# Patient Record
Sex: Female | Born: 1995 | Hispanic: Yes | Marital: Single | Smoking: Never smoker
Health system: Southern US, Community
[De-identification: ages and names within clinical notes are randomized; demographics above are authoritative.]

## PROBLEM LIST (undated history)

## (undated) DIAGNOSIS — E282 Polycystic ovarian syndrome: Secondary | ICD-10-CM

## (undated) DIAGNOSIS — Z9889 Other specified postprocedural states: Secondary | ICD-10-CM

## (undated) HISTORY — DX: Polycystic ovarian syndrome: E28.2

---
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
## 2018-11-30 ENCOUNTER — Emergency Department: Payer: 59

## 2018-11-30 ENCOUNTER — Emergency Department
Admission: EM | Admit: 2018-11-30 | Discharge: 2018-12-01 | Disposition: A | Discharge status: Home / Self Care | Payer: 59 | Attending: Emergency Medicine | Admitting: Emergency Medicine

## 2018-11-30 ENCOUNTER — Encounter: Payer: Self-pay | Admitting: Radiology

## 2018-11-30 DIAGNOSIS — R1031 Right lower quadrant pain: Secondary | ICD-10-CM | POA: Diagnosis not present

## 2018-11-30 LAB — POCT PREGNANCY, URINE: Preg Test, Ur: NEGATIVE

## 2018-11-30 LAB — URINALYSIS, COMPLETE (UACMP) WITH MICROSCOPIC
Bacteria, UA: NONE SEEN
Bilirubin Urine: NEGATIVE
Glucose, UA: NEGATIVE mg/dL
Hgb urine dipstick: NEGATIVE
Ketones, ur: NEGATIVE mg/dL
Leukocytes,Ua: NEGATIVE
Nitrite: NEGATIVE
Protein, ur: NEGATIVE mg/dL
Specific Gravity, Urine: 1.026 (ref 1.005–1.030)
pH: 7 (ref 5.0–8.0)

## 2018-11-30 LAB — COMPREHENSIVE METABOLIC PANEL
ALT: 17 U/L (ref 0–44)
AST: 19 U/L (ref 15–41)
Albumin: 4.4 g/dL (ref 3.5–5.0)
Alkaline Phosphatase: 103 U/L (ref 38–126)
Anion gap: 9 (ref 5–15)
BUN: 11 mg/dL (ref 6–20)
CO2: 26 mmol/L (ref 22–32)
Calcium: 9.3 mg/dL (ref 8.9–10.3)
Chloride: 103 mmol/L (ref 98–111)
Creatinine, Ser: 0.72 mg/dL (ref 0.44–1.00)
GFR calc Af Amer: >60 mL/min (ref >60)
GFR calc non Af Amer: >60 mL/min (ref >60)
Glucose, Bld: 121 mg/dL — ABNORMAL HIGH (ref 70–99)
Potassium: 3.2 mmol/L — ABNORMAL LOW (ref 3.5–5.1)
Sodium: 138 mmol/L (ref 135–145)
Total Bilirubin: 0.7 mg/dL (ref 0.3–1.2)
Total Protein: 7.6 g/dL (ref 6.5–8.1)

## 2018-11-30 LAB — CBC
HCT: 40.8 % (ref 36.0–46.0)
Hemoglobin: 13.9 g/dL (ref 12.0–15.0)
MCH: 29.1 pg (ref 26.0–34.0)
MCHC: 34.1 g/dL (ref 30.0–36.0)
MCV: 85.4 fL (ref 80.0–100.0)
Platelets: 269 10*3/uL (ref 150–400)
RBC: 4.78 MIL/uL (ref 3.87–5.11)
RDW: 11.7 % (ref 11.5–15.5)
WBC: 9.2 10*3/uL (ref 4.0–10.5)
nRBC: 0 % (ref 0.0–0.2)

## 2018-11-30 LAB — LIPASE, BLOOD: Lipase: 32 U/L (ref 11–51)

## 2018-11-30 MED ORDER — ONDANSETRON HCL 4 MG/2ML IJ SOLN
4.0000 mg | Freq: Once | INTRAMUSCULAR | Status: AC
  Administered 2018-11-30: 4 mg via INTRAVENOUS
  Filled 2018-11-30: qty 2

## 2018-11-30 MED ORDER — IOHEXOL 300 MG/ML  SOLN
100.0000 mL | Freq: Once | INTRAMUSCULAR | Status: AC | PRN
  Administered 2018-11-30: 100 mL via INTRAVENOUS
  Filled 2018-11-30: qty 100

## 2018-11-30 MED ORDER — IOHEXOL 9 MG/ML PO SOLN
1000.0000 mL | Freq: Once | ORAL | Status: AC
  Administered 2018-11-30: 1000 mL via ORAL
  Filled 2018-11-30: qty 1000

## 2018-11-30 MED ORDER — SODIUM CHLORIDE 0.9 % IV BOLUS
1000.0000 mL | Freq: Once | INTRAVENOUS | Status: AC
  Administered 2018-11-30: 22:00:00 1000 mL via INTRAVENOUS

## 2018-11-30 NOTE — ED Provider Notes (Signed)
[facility] Emergency Department Provider Note ____________________________________________   First MD Initiated Contact with Patient 11/30/18 2034     (approximate)  I have reviewed the triage vital signs and the nursing notes.   HISTORY  Chief Complaint Abdominal Pain    HPI Kim Maxwell is a 23 y.o. female with no significant PMH who presents with right lower quadrant abdominal pain, acute onset over the last several hours, persistent course, and associated with nausea but no vomiting.  The patient states that the pain started vaguely in the periumbilical area, and then moved to the right lower quadrant.  She denies any prior history of this pain.  She has no urinary symptoms, vaginal bleeding or discharge, or diarrhea.  No past medical history on file.  There are no active problems to display for this patient.   No past surgical history on file.  Prior to Admission medications   Medication Sig Start Date End Date Taking? Authorizing Provider  predniSONE (DELTASONE) 20 MG tablet Take 2 tablets (40 mg total) by mouth daily. 06/25/15   Eula Listen, MD    Allergies Patient has no known allergies.  No family history on file.  Social History Social History   Tobacco Use  . Smoking status: Never Smoker  Substance Use Topics  . Alcohol use: No  . Drug use: Not on file    Review of Systems  Constitutional: Positive for subjective fever. Eyes: No visual changes. ENT: No sore throat. Cardiovascular: Denies chest pain. Respiratory: Denies shortness of breath. Gastrointestinal: Positive for nausea.  No vomiting or diarrhea.  Genitourinary: Negative for dysuria.  No vaginal bleeding or discharge. Musculoskeletal: Negative for back pain. Skin: Negative for rash. Neurological: Negative for headache.   ____________________________________________   PHYSICAL EXAM:  VITAL SIGNS: ED Triage Vitals  Enc Vitals Group     BP  11/30/18 1933 (!) 149/86     Pulse Rate 11/30/18 1933 70     Resp 11/30/18 1933 18     Temp 11/30/18 1933 99.1 F (37.3 C)     Temp Source 11/30/18 1938 Oral     SpO2 11/30/18 1933 100 %     Weight --      Height --      Head Circumference --      Peak Flow --      Pain Score --      Pain Loc --      Pain Edu? --      Excl. in [place]? --     Constitutional: Alert and oriented. Well appearing and in no acute distress. Eyes: Conjunctivae are normal.  No scleral icterus. Head: Atraumatic. Nose: No congestion/rhinnorhea. Mouth/Throat: Mucous membranes are moist.   Neck: Normal range of motion.  Cardiovascular: Good peripheral circulation. Respiratory: Normal respiratory effort.  No retractions. Gastrointestinal: Soft with mild right lower quadrant tenderness.  No distention.  Genitourinary: No flank tenderness. Musculoskeletal: Extremities warm and well perfused.  Neurologic:  Normal speech and language. No gross focal neurologic deficits are appreciated.  Skin:  Skin is warm and dry. No rash noted. Psychiatric: Mood and affect are normal. Speech and behavior are normal.  ____________________________________________   LABS (all labs ordered are listed, but only abnormal results are displayed)  Labs Reviewed  COMPREHENSIVE METABOLIC PANEL - Abnormal; Notable for the following components:      Result Value   Potassium 3.2 (*)    Glucose, Bld 121 (*)    All other components within normal limits  URINALYSIS, COMPLETE (UACMP) WITH MICROSCOPIC - Abnormal; Notable for the following components:   Color, Urine YELLOW (*)    APPearance CLEAR (*)    All other components within normal limits  LIPASE, BLOOD  CBC  POCT PREGNANCY, URINE  POC URINE PREG, ED   ____________________________________________  EKG  ____________________________________________  RADIOLOGY  CT abdomen: Pending  ____________________________________________   PROCEDURES  Procedure(s) performed: No   Procedures  Critical Care performed: No ____________________________________________   INITIAL IMPRESSION / ASSESSMENT AND PLAN / ED COURSE  Pertinent labs & imaging results that were available during my care of the patient were reviewed by me and considered in my medical decision making (see chart for details).  23 year old female with no significant PMH presents with abdominal pain, initially more periumbilical but now localized to the right lower quadrant.  It is associated with nausea but no vomiting.  She denies prior history of this pain.  On exam, the patient is well-appearing.  Her vital signs are normal except for a low-grade temperature.  She does have mild focal right lower quadrant tenderness but no peritoneal signs.  Overall I am concerned for appendicitis.  Differential also includes terminal ileitis, mesenteric adenitis or other benign etiology.  Initial lab work-up is unremarkable.  The urinalysis is negative.  We will obtain a CT to evaluate further.  ----------------------------------------- 9:48 PM on 11/30/2018 -----------------------------------------  CT abdomen is pending.  I have signed the patient out to the oncoming physician Dr. Lenard Lance.  ____________________________________________   FINAL CLINICAL IMPRESSION(S) / ED DIAGNOSES  Final diagnoses:  Right lower quadrant abdominal pain      NEW MEDICATIONS STARTED DURING THIS VISIT:  New Prescriptions   No medications on file     Note:  This document was prepared using Dragon voice recognition software and may include unintentional dictation errors.    Dionne Bucy, MD 11/30/18 2149

## 2018-11-30 NOTE — ED Notes (Signed)
PT to CT scan

## 2018-11-30 NOTE — ED Triage Notes (Signed)
Pt c/o LRQ abdominal pain since 1730 today. Pt denies V/D but has nausea. Pt denies urinary symptoms.

## 2018-12-01 NOTE — Discharge Instructions (Signed)
Your lab tests and CT scan of the abdomen and pelvis were all okay today.  Take anti-inflammatory medicine such as ibuprofen and/or Tylenol for pain control and monitor your symptoms.  If symptoms are worsening or you develop fever, vomiting, or severe pain please return to the emergency room.  If symptoms have not resolved within 24 hours, please go to primary care for reassessment.

## 2018-12-01 NOTE — ED Provider Notes (Signed)
Procedures     ----------------------------------------- 12:10 AM on 12/01/2018 -----------------------------------------  Assumed care pending CT scan.  Vital signs remain normal.  CT scan is unremarkable with a normal-appearing appendix.  Lab work-up is reassuring.  No clear source of the pain, but at this point I doubt appendicitis, torsion TOA PID STI cystitis pyelonephritis ureterolithiasis or other acute intra-abdominal process.  Stable for outpatient follow-up, NSAIDs for pain control.   Carrie Mew, MD 12/01/18 [phone]

## 2019-10-04 ENCOUNTER — Other Ambulatory Visit: Payer: Self-pay

## 2019-10-04 ENCOUNTER — Ambulatory Visit (INDEPENDENT_AMBULATORY_CARE_PROVIDER_SITE_OTHER): Payer: Self-pay | Admitting: Gastroenterology

## 2019-10-04 VITALS — BP 142/98 | HR 103 | Temp 98.7°F | Ht <= 58 in | Wt 161.0 lb

## 2019-10-04 DIAGNOSIS — K581 Irritable bowel syndrome with constipation: Secondary | ICD-10-CM

## 2019-10-04 NOTE — Patient Instructions (Signed)

## 2019-10-04 NOTE — Progress Notes (Signed)
[name] MD, MRCP(U.K) [address]  [address]  [place], [place] [address]  Main: [phone]  Fax: [phone]   Gastroenterology Consultation  Referring Provider:     Toy Cookey, FNP Primary Care Physician:  Patient, No Pcp Per Primary Gastroenterologist:  Dr. [name]  Reason for Consultation:     Abdominal pain        HPI:   Kim Maxwell is a 24 y.o. y/o female has been referred for abdominal pain on 09/27/2019.  Review of epic shows no recent labs last hemoglobin in November 2020 was 13.9 g she underwent a CT scan of the abdomen and pelvis in November 2020 for abdominal pain with concern for acute appendicitis which showed no acute abnormality but some mild bladder wall thickening which could possibly indicate cystitis  She presented to the emergency room in November 2020 with right lower quadrant pain and after the CT scan was sent home.  She says that she has had abdominal pain her entire life.  Points to her epigastrium.  On and off.  Usually right after she eats.  Relieved after good bowel movement.  She does also suffer from constipation her whole life.  Does not have a bowel movement daily.  At times it is very hard.  When it is very hard her abdominal symptoms are worse.  Has not tried any laxatives.  Does have a diet rich in fruit and vegetable.  No family history of colon cancer or polyps.  No weight loss or other weight gain.  Denies any NSAID use.  The pain is nonradiating.  Over the last few months it is better but still occurs on a regular basis.  No family history of gallbladder problems.   No past medical history on file.  No past surgical history on file.  Prior to Admission medications   Medication Sig Start Date End Date Taking? Authorizing Provider  predniSONE (DELTASONE) 20 MG tablet Take 2 tablets (40 mg total) by mouth daily. 06/25/15   Rockne Menghini, MD    No family history on file.   Social History   Tobacco Use  . Smoking  status: Never Smoker  Substance Use Topics  . Alcohol use: No  . Drug use: Not on file    Allergies as of 10/04/2019  . (No Known Allergies)    Review of Systems:    All systems reviewed and negative except where noted in HPI.   Physical Exam:  BP (!) 142/98   Pulse (!) 103   Temp 98.7 F (37.1 C)   Ht 4\' 10"  (1.473 m)   Wt 161 lb (73 kg)   BMI 33.65 kg/m  No LMP recorded. Psych:  Alert and cooperative. Normal mood and affect. General:   Alert,  Well-developed, well-nourished, pleasant and cooperative in NAD Head:  Normocephalic and atraumatic. Eyes:  Sclera clear, no icterus.   Conjunctiva pink. Ears:  Normal auditory acuity. Lungs:  Respirations even and unlabored.  Clear throughout to auscultation.   No wheezes, crackles, or rhonchi. No acute distress. Heart:  Regular rate and rhythm; no murmurs, clicks, rubs, or gallops. Abdomen:  Normal bowel sounds.  No bruits.  Soft, non-tender and non-distended without masses, hepatosplenomegaly or hernias noted.  No guarding or rebound tenderness.    Neurologic:  Alert and oriented x3;  grossly normal neurologically. Psych:  Alert and cooperative. Normal mood and affect.  Imaging Studies: No results found.  Assessment and Plan:   [name] is a 24  y.o. 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  No red flag signs.  Plan 1.  High-fiber diet: Counseled on obtaining a target of 25 to 30 g/day.  Patient information provided. 2.  Commence on Linzess 72 mcg: Samples provided 3.  Check H. pylori breath test, TSH 4.  If pain is not relieved with effective treatment of her constipation then may consider gallbladder evaluation  Follow up in 4 weeks telephone visit  Dr [name] MD,MRCP(U.K)

## 2019-10-05 LAB — T4, FREE: Free T4: 1.27 ng/dL (ref 0.82–1.77)

## 2019-10-05 LAB — SPECIMEN STATUS REPORT

## 2019-10-05 NOTE — Progress Notes (Signed)
Inform TSH is abnormal - check free t 4

## 2019-10-06 LAB — H. PYLORI BREATH TEST: H pylori Breath Test: NEGATIVE

## 2019-10-06 LAB — TSH: TSH: 5.59 u[IU]/mL — ABNORMAL HIGH (ref 0.450–4.500)

## 2019-10-20 ENCOUNTER — Telehealth: Payer: Self-pay

## 2019-10-20 NOTE — Telephone Encounter (Signed)
-----   Message from [name], MD sent at 10/19/2019  9:15 AM EDT ----- Free t4 normal- repeat TSH, Free t4 in 3-4 months and follow up with PCP

## 2019-10-20 NOTE — Telephone Encounter (Signed)
Called pt to inform of results and Dr. Anna's recommendations.  Unable to contact, LVM to return call 

## 2019-10-20 NOTE — Telephone Encounter (Signed)
Patient returning call. Call forwarded to [facility].

## 2019-10-20 NOTE — Telephone Encounter (Signed)
Pt has been notified of results and Dr. Anna's recommendations. 

## 2019-12-19 ENCOUNTER — Ambulatory Visit: Payer: Self-pay | Admitting: Gastroenterology

## 2020-02-08 ENCOUNTER — Other Ambulatory Visit: Payer: Self-pay

## 2020-02-08 ENCOUNTER — Encounter: Payer: Self-pay | Admitting: Gastroenterology

## 2020-02-08 ENCOUNTER — Ambulatory Visit (INDEPENDENT_AMBULATORY_CARE_PROVIDER_SITE_OTHER): Payer: Self-pay | Admitting: Gastroenterology

## 2020-02-08 VITALS — BP 136/90 | HR 86 | Temp 98.4°F | Ht <= 58 in | Wt 163.6 lb

## 2020-02-08 DIAGNOSIS — R110 Nausea: Secondary | ICD-10-CM

## 2020-02-08 DIAGNOSIS — K219 Gastro-esophageal reflux disease without esophagitis: Secondary | ICD-10-CM

## 2020-02-08 MED ORDER — OMEPRAZOLE 40 MG PO CPDR: 40.0000 mg | DELAYED_RELEASE_CAPSULE | Freq: Every day | ORAL | 0 refills | Status: DC

## 2020-02-08 NOTE — Patient Instructions (Signed)
Low-FODMAP Eating Plan  FODMAP stands for fermentable oligosaccharides, disaccharides, monosaccharides, and polyols. These are sugars that are hard for some people to digest. A low-FODMAP eating plan may help some people who have irritable bowel syndrome (IBS) and certain other bowel (intestinal) diseases to manage their symptoms. This meal plan can be complicated to follow. Work with a diet and nutrition specialist (dietitian) to make a low-FODMAP eating plan that is right for you. A dietitian can help make sure that you get enough nutrition from this diet. What are tips for following this plan? Reading food labels  Check labels for hidden FODMAPs such as: ? High-fructose syrup. ? Honey. ? Agave. ? Natural fruit flavors. ? Onion or garlic powder.  Choose low-FODMAP foods that contain 3-4 grams of fiber per serving.  Check food labels for serving sizes. Eat only one serving at a time to make sure FODMAP levels stay low. Shopping  Shop with a list of foods that are recommended on this diet and make a meal plan. Meal planning  Follow a low-FODMAP eating plan for up to 6 weeks, or as told by your health care provider or dietitian.  To follow the eating plan: 1. Eliminate high-FODMAP foods from your diet completely. Choose only low-FODMAP foods to eat. You will do this for 2-6 weeks. 2. Gradually reintroduce high-FODMAP foods into your diet one at a time. Most people should wait a few days before introducing the next new high-FODMAP food into their meal plan. Your dietitian can recommend how quickly you may reintroduce foods. 3. Keep a daily record of what and how much you eat and drink. Make note of any symptoms that you have after eating. 4. Review your daily record with a dietitian regularly to identify which foods you can eat and which foods you should avoid. General tips  Drink enough fluid each day to keep your urine pale yellow.  Avoid processed foods. These often have added sugar  and may be high in FODMAPs.  Avoid most dairy products, whole grains, and sweeteners.  Work with a dietitian to make sure you get enough fiber in your diet.  Avoid high FODMAP foods at meals to manage symptoms. Recommended foods Fruits Bananas, oranges, tangerines, lemons, limes, blueberries, raspberries, strawberries, grapes, cantaloupe, honeydew melon, kiwi, papaya, passion fruit, and pineapple. Limited amounts of dried cranberries, banana chips, and shredded coconut. Vegetables Eggplant, zucchini, cucumber, peppers, green beans, bean sprouts, lettuce, arugula, kale, Swiss chard, spinach, collard greens, bok choy, summer squash, potato, and tomato. Limited amounts of corn, carrot, and sweet potato. Green parts of scallions. Grains Gluten-free grains, such as rice, oats, buckwheat, quinoa, corn, polenta, and millet. Gluten-free pasta, bread, or cereal. Rice noodles. Corn tortillas. Meats and other proteins Unseasoned beef, pork, poultry, or fish. Eggs. Berniece Salines. Tofu (firm) and tempeh. Limited amounts of nuts and seeds, such as almonds, walnuts, [place] nuts, pecans, peanuts, nut butters, pumpkin seeds, chia seeds, and sunflower seeds. Dairy Lactose-free milk, yogurt, and kefir. Lactose-free cottage cheese and ice cream. Non-dairy milks, such as almond, coconut, hemp, and rice milk. Non-dairy yogurt. Limited amounts of goat cheese, brie, mozzarella, parmesan, swiss, and other hard cheeses. Fats and oils Butter-free spreads. Vegetable oils, such as olive, canola, and sunflower oil. Seasoning and other foods Artificial sweeteners with names that do not end in "ol," such as aspartame, saccharine, and stevia. Maple syrup, white table sugar, raw sugar, brown sugar, and molasses. Mayonnaise, soy sauce, and tamari. Fresh basil, coriander, parsley, rosemary, and thyme. Beverages Water and  mineral water. Sugar-sweetened soft drinks. Small amounts of orange juice or cranberry juice. Black and green tea.  Most dry wines. Coffee. The items listed above may not be a complete list of foods and beverages you can eat. Contact a dietitian for more information. Foods to avoid Fruits Fresh, dried, and juiced forms of apple, pear, watermelon, peach, plum, cherries, apricots, blackberries, boysenberries, figs, nectarines, and mango. Avocado. Vegetables Chicory root, artichoke, asparagus, cabbage, snow peas, Brussels sprouts, broccoli, sugar snap peas, mushrooms, celery, and cauliflower. Onions, garlic, leeks, and the white part of scallions. Grains Wheat, including kamut, durum, and semolina. Barley and bulgur. Couscous. Wheat-based cereals. Wheat noodles, bread, crackers, and pastries. Meats and other proteins Fried or fatty meat. Sausage. Cashews and pistachios. Soybeans, baked beans, black beans, chickpeas, kidney beans, fava beans, navy beans, lentils, black-eyed peas, and split peas. Dairy Milk, yogurt, ice cream, and soft cheese. Cream and sour cream. Milk-based sauces. Custard. Buttermilk. Soy milk. Seasoning and other foods Any sugar-free gum or candy. Foods that contain artificial sweeteners such as sorbitol, mannitol, isomalt, or xylitol. Foods that contain honey, high-fructose corn syrup, or agave. Bouillon, vegetable stock, beef stock, and chicken stock. Garlic and onion powder. Condiments made with onion, such as hummus, chutney, pickles, relish, salad dressing, and salsa. Tomato paste. Beverages Chicory-based drinks. Coffee substitutes. Chamomile tea. Fennel tea. Sweet or fortified wines such as port or sherry. Diet soft drinks made with isomalt, mannitol, maltitol, sorbitol, or xylitol. Apple, pear, and mango juice. Juices with high-fructose corn syrup. The items listed above may not be a complete list of foods and beverages you should avoid. Contact a dietitian for more information. Summary  FODMAP stands for fermentable oligosaccharides, disaccharides, monosaccharides, and polyols. These  are sugars that are hard for some people to digest.  A low-FODMAP eating plan is a short-term diet that helps to ease symptoms of certain bowel diseases.  The eating plan usually lasts up to 6 weeks. After that, high-FODMAP foods are reintroduced gradually and one at a time. This can help you find out which foods may be causing symptoms.  A low-FODMAP eating plan can be complicated. It is best to work with a dietitian who has experience with this type of plan. This information is not intended to replace advice given to you by your health care provider. Make sure you discuss any questions you have with your health care provider. Document Revised: 05/25/2019 Document Reviewed: 05/25/2019 Elsevier Patient Education  2021 Elsevier Inc. Gastroesophageal Reflux Disease, Adult  Gastroesophageal reflux (GER) happens when acid from the stomach flows up into the tube that connects the mouth and the stomach (esophagus). Normally, food travels down the esophagus and stays in the stomach to be digested. With GER, food and stomach acid sometimes move back up into the esophagus. You may have a disease called gastroesophageal reflux disease (GERD) if the reflux:  Happens often.  Causes frequent or very bad symptoms.  Causes problems such as damage to the esophagus. When this happens, the esophagus becomes sore and swollen. Over time, GERD can make small holes (ulcers) in the lining of the esophagus. What are the causes? This condition is caused by a problem with the muscle between the esophagus and the stomach. When this muscle is weak or not normal, it does not close properly to keep food and acid from coming back up from the stomach. The muscle can be weak because of:  Tobacco use.  Pregnancy.  Having a certain type of hernia (hiatal hernia).  Alcohol use.  Certain foods and drinks, such as coffee, chocolate, onions, and peppermint. What increases the risk?  Being overweight.  Having a  disease that affects your connective tissue.  Taking NSAIDs, such a ibuprofen. What are the signs or symptoms?  Heartburn.  Difficult or painful swallowing.  The feeling of having a lump in the throat.  A bitter taste in the mouth.  Bad breath.  Having a lot of saliva.  Having an upset or bloated stomach.  Burping.  Chest pain. Different conditions can cause chest pain. Make sure you see your doctor if you have chest pain.  Shortness of breath or wheezing.  A long-term cough or a cough at night.  Wearing away of the surface of teeth (tooth enamel).  Weight loss. How is this treated?  Making changes to your diet.  Taking medicine.  Having surgery. Treatment will depend on how bad your symptoms are. Follow these instructions at home: Eating and drinking  Follow a diet as told by your doctor. You may need to avoid foods and drinks such as: ? Coffee and tea, with or without caffeine. ? Drinks that contain alcohol. ? Energy drinks and sports drinks. ? Bubbly (carbonated) drinks or sodas. ? Chocolate and cocoa. ? Peppermint and mint flavorings. ? Garlic and onions. ? Horseradish. ? Spicy and acidic foods. These include peppers, chili powder, curry powder, vinegar, hot sauces, and BBQ sauce. ? Citrus fruit juices and citrus fruits, such as oranges, lemons, and limes. ? Tomato-based foods. These include red sauce, chili, salsa, and pizza with red sauce. ? Fried and fatty foods. These include donuts, french fries, potato chips, and high-fat dressings. ? High-fat meats. These include hot dogs, rib eye steak, sausage, ham, and bacon. ? High-fat dairy items, such as whole milk, butter, and cream cheese.  Eat small meals often. Avoid eating large meals.  Avoid drinking large amounts of liquid with your meals.  Avoid eating meals during the 2-3 hours before bedtime.  Avoid lying down right after you eat.  Do not exercise right after you eat.   Lifestyle  Do not  smoke or use any products that contain nicotine or tobacco. If you need help quitting, ask your doctor.  Try to lower your stress. If you need help doing this, ask your doctor.  If you are overweight, lose an amount of weight that is healthy for you. Ask your doctor about a safe weight loss goal.   General instructions  Pay attention to any changes in your symptoms.  Take over-the-counter and prescription medicines only as told by your doctor.  Do not take aspirin, ibuprofen, or other NSAIDs unless your doctor says it is okay.  Wear loose clothes. Do not wear anything tight around your waist.  Raise (elevate) the head of your bed about 6 inches (15 cm). You may need to use a wedge to do this.  Avoid bending over if this makes your symptoms worse.  Keep all follow-up visits. Contact a doctor if:  You have new symptoms.  You lose weight and you do not know why.  You have trouble swallowing or it hurts to swallow.  You have wheezing or a cough that keeps happening.  You have a hoarse voice.  Your symptoms do not get better with treatment. Get help right away if:  You have sudden pain in your arms, neck, jaw, teeth, or back.  You suddenly feel sweaty, dizzy, or light-headed.  You have chest pain or shortness of breath.  You vomit and the vomit is green, yellow, or black, or it looks like blood or coffee grounds.  You faint.  Your poop (stool) is red, bloody, or black.  You cannot swallow, drink, or eat. These symptoms may represent a serious problem that is an emergency. Do not wait to see if the symptoms will go away. Get medical help right away. Call your local emergency services (911 in the [place] Do not drive yourself to the hospital. Summary  If a person has gastroesophageal reflux disease (GERD), food and stomach acid move back up into the esophagus and cause symptoms or problems such as damage to the esophagus.  Treatment will depend on how bad your symptoms  are.  Follow a diet as told by your doctor.  Take all medicines only as told by your doctor. This information is not intended to replace advice given to you by your health care provider. Make sure you discuss any questions you have with your health care provider. Document Revised: 07/17/2019 Document Reviewed: 07/17/2019 Elsevier Patient Education  2021 [employer].

## 2020-02-08 NOTE — Progress Notes (Signed)
[name] MD, MRCP(U.K) [address]  [address]  [place], [place] [address]  Main: [phone]  Fax: [phone]   Primary Care Physician: Santiago Bur, [place]  Primary Gastroenterologist:  Dr. [name]   IBS follow-up  HPI: Kim Maxwell is a 25 y.o. female    Summary of history :  Initially referred and seen in 10/09/2019 for abdominal pain.  CT scan of the abdomen pelvis in November 2020 showed no acute abnormalities but blood thickening which could be cystitis.  At that time she had presented the emergency room with right lower quadrant pain and was discharged home.She says that she has had abdominal pain her entire life.  Points to her epigastrium.  On and off.  Usually right after she eats.  Relieved after good bowel movement.  She does also suffer from constipation her whole life.  Does not have a bowel movement daily.  At times it is very hard.  When it is very hard her abdominal symptoms are worse.  Has not tried any laxatives.  Does have a diet rich in fruit and vegetable.  No family history of colon cancer or polyps.  No weight loss or other weight gain.  Denies any NSAID use.  The pain is nonradiating.  Over the last few months it is better but still occurs on a regular basis.  No family history of gallbladder problems.  Interval history 10/04/2019-02/08/2020  At her last visit commenced on high-fiber diet and commenced on Linzess 72 mcg/day.  H. pylori breath test was negative and had a TSH that was elevated but normal free T4.  Seen at the emergency room today at [facility] for acute cystitis.  Urine analysis showed large quantity of blood WBC mucus and leukocyte esterase.  Given a course of nitrofurantoin.  She states that after her last visit a few months back she felt much better after taking the Linzess most of her symptoms disappeared and she gained about 15 pounds of weight and around Christmas time she started developing nausea to the later part of her day.  Was  worse when she lays flat.  Denies any heartburn.  Denies any NSAID use.  The nausea began a few hours after eating associated with some gas and bloating.  Presently denies any issues with constipation.  Current Outpatient Medications  Medication Sig Dispense Refill  . nitrofurantoin, macrocrystal-monohydrate, (MACROBID) 100 MG capsule Take 100 mg by mouth in the morning and at bedtime.    . ondansetron (ZOFRAN) 4 MG tablet Take 4 mg by mouth 3 (three) times daily as needed.    . predniSONE (DELTASONE) 20 MG tablet Take 2 tablets (40 mg total) by mouth daily. 8 tablet 0   No current facility-administered medications for this visit.    Allergies as of 02/08/2020  . (No Known Allergies)    ROS:  General: Negative for anorexia, weight loss, fever, chills, fatigue, weakness. ENT: Negative for hoarseness, difficulty swallowing , nasal congestion. CV: Negative for chest pain, angina, palpitations, dyspnea on exertion, peripheral edema.  Respiratory: Negative for dyspnea at rest, dyspnea on exertion, cough, sputum, wheezing.  GI: See history of present illness. GU:  Negative for dysuria, hematuria, urinary incontinence, urinary frequency, nocturnal urination.  Endo: Negative for unusual weight change.    Physical Examination:   BP 136/90 (BP Location: Left Arm, Patient Position: Sitting, Cuff Size: Normal)   Pulse 86   Temp 98.4 F (36.9 C) (Oral)   Ht 4\' 10"  (1.473 m)  Wt 163 lb 9.6 oz (74.2 kg)   BMI 34.19 kg/m   General: Well-nourished, well-developed in no acute distress.  Eyes: No icterus. Conjunctivae pink. Neuro: Alert and oriented x 3.  Grossly intact. Skin: Warm and dry, no jaundice.   Psych: Alert and cooperative, normal mood and affect.   Imaging Studies: No results found.  Assessment and Plan:   Kim Maxwell is a 25 y.o. y/o female here to follow-up for chronic abdominal pain suggestive of IBS constipation which has since resolved.  Present issues are nausea  which is likely due to GERD which has worsened due to weight gain .  Plan 1. Trial of PPI Prilosec 40 mg daily for 6 weeks , if fails consider endoscopy and possibly gallbladder evaluation 2.  Low FODMAP diet for gas and bloating 3.  GERD patient information provided and counseled on lifestyle changes.     Dr [name]  MD,MRCP [facility]) Follow up in 2 weeks telephone visit

## 2020-02-21 ENCOUNTER — Telehealth: Payer: Self-pay | Admitting: Gastroenterology

## 2020-02-21 ENCOUNTER — Other Ambulatory Visit: Payer: Self-pay

## 2020-02-21 ENCOUNTER — Telehealth (INDEPENDENT_AMBULATORY_CARE_PROVIDER_SITE_OTHER): Payer: Self-pay | Admitting: Gastroenterology

## 2020-02-21 DIAGNOSIS — K219 Gastro-esophageal reflux disease without esophagitis: Secondary | ICD-10-CM

## 2020-02-21 DIAGNOSIS — R112 Nausea with vomiting, unspecified: Secondary | ICD-10-CM

## 2020-02-21 DIAGNOSIS — R109 Unspecified abdominal pain: Secondary | ICD-10-CM

## 2020-02-21 NOTE — Progress Notes (Signed)
Letter of HIDA scan appointment has been mailed to patient as well as EGD instructions. Pt verbalized understanding.

## 2020-02-21 NOTE — Telephone Encounter (Signed)
Patient called and ready to schedule her procedure and ultrasound.

## 2020-02-21 NOTE — Progress Notes (Signed)
Kim Maxwell , MD [address]  [address]  [place], [place] [address]  Main: [phone]  Fax: [phone]   Primary Care Physician: Santiago Bur, [place]  Virtual Visit via Telephone Note  I connected with patient on 02/21/20 at  8:30 AM EST by telephone and verified that I am speaking with the correct person using two identifiers.   I discussed the limitations, risks, security and privacy concerns of performing an evaluation and management service by telephone and the availability of in person appointments. I also discussed with the patient that there may be a patient responsible charge related to this service. The patient expressed understanding and agreed to proceed.  Location of Patient: Home Location of Provider: Home Persons involved: Patient and provider only   History of Present Illness:   Follow-up for nausea and GERD  HPI: Kim Maxwell is a 25 y.o. female   Summary of history :  Initially referred and seen in 10/09/2019 for abdominal pain.  CT scan of the abdomen pelvis in November 2020 showed no acute abnormalities but blood thickening which could be cystitis.  At that time she had presented the emergency room with right lower quadrant pain and was discharged home.She says that she has had abdominal pain her entire life. Points to her epigastrium. On and off. Usually right after she eats. Relieved after good bowel movement. She does also suffer from constipation her whole life. Does not have a bowel movement daily. At times it is very hard. When it is very hard her abdominal symptoms are worse. Has not tried any laxatives. Does have a diet rich in fruit and vegetable. No family history of colon cancer or polyps. No weight loss or other weight gain. Denies any NSAID use. The pain is nonradiating. Over the last few months it is better but still occurs on a regular basis. No family history of gallbladder problems. In 09/2019 commenced on high-fiber  diet and commenced on Linzess 72 mcg/day.  H. pylori breath test was negative and had a TSH that was elevated but normal free T4.  Significant improvement with her constipation by commencing on Linzess   Interval history 02/08/2020-02/21/2020  She states that after commencing on the PPI she started feeling a bit better but still has persistent nausea and abdominal discomfort although less intense.  The bloating has significantly improved after commencing on the low FODMAP diet     Current Outpatient Medications  Medication Sig Dispense Refill  . omeprazole (PRILOSEC) 40 MG capsule Take 1 capsule (40 mg total) by mouth daily. 30 capsule 0  . ondansetron (ZOFRAN) 4 MG tablet Take 4 mg by mouth 3 (three) times daily as needed.    . predniSONE (DELTASONE) 20 MG tablet Take 2 tablets (40 mg total) by mouth daily. 8 tablet 0   No current facility-administered medications for this visit.    Allergies as of 02/21/2020  . (No Known Allergies)    Review of Systems:    All systems reviewed and negative except where noted in HPI.   Observations/Objective:  Labs: CMP     Component Value Date/Time   NA 138 11/30/2018 1940   NA 139 02/28/2013 0716   K 3.2 (L) 11/30/2018 1940   K 4.0 02/28/2013 0716   CL 103 11/30/2018 1940   CL 106 02/28/2013 0716   CO2 26 11/30/2018 1940   CO2 27 (H) 02/28/2013 0716   GLUCOSE 121 (H) 11/30/2018 1940   GLUCOSE 86 02/28/2013 0716  BUN 11 11/30/2018 1940   BUN 12 02/28/2013 0716   CREATININE 0.72 11/30/2018 1940   CREATININE 0.59 (L) 02/28/2013 0716   CALCIUM 9.3 11/30/2018 1940   CALCIUM 8.8 (L) 02/28/2013 0716   PROT 7.6 11/30/2018 1940   PROT 7.7 02/28/2013 0716   ALBUMIN 4.4 11/30/2018 1940   ALBUMIN 4.3 02/28/2013 0716   AST 19 11/30/2018 1940   AST 20 02/28/2013 0716   ALT 17 11/30/2018 1940   ALT 24 02/28/2013 0716   ALKPHOS 103 11/30/2018 1940   ALKPHOS 127 (H) 02/28/2013 0716   BILITOT 0.7 11/30/2018 1940   BILITOT 0.7 02/28/2013  0716   GFRNONAA >60 11/30/2018 1940   GFRAA >60 11/30/2018 1940   Lab Results  Component Value Date   WBC 9.2 11/30/2018   HGB 13.9 11/30/2018   HCT 40.8 11/30/2018   MCV 85.4 11/30/2018   PLT 269 11/30/2018    Imaging Studies: No results found.  Assessment and Plan:   Kim Maxwell is a 25 y.o. y/o female here to follow-up to her last visit when she had symptoms of nausea and acid reflux.  Since commencing on Prilosec 40 mg she feels better but still has persistent nausea and abdominal discomfort.  At the last visit she also had some bloating which is significantly improved after going on a low FODMAP diet.  Options were provided to watch and wait for 3 more weeks versus proceeding investigations and she chose to proceed with scheduling her tests to evaluate the gallbladder and endoscopy  Plan 1.   Continue Prilosec 40 mg once a day 2.  EGD to evaluate for abdominal pain 3.  HIDA scan to evaluate for gallbladder dyskinesia 4.  Office visit in 6 weeks  I have discussed alternative options, risks & benefits,  which include, but are not limited to, bleeding, infection, perforation,respiratory complication & drug reaction.  The patient agrees with this plan & written consent will be obtained.       I discussed the assessment and treatment plan with the patient. The patient was provided an opportunity to ask questions and all were answered. The patient agreed with the plan and demonstrated an understanding of the instructions.   The patient was advised to call back or seek an in-person evaluation if the symptoms worsen or if the condition fails to improve as anticipated.  I provided 15 minutes of non-face-to-face time during this encounter.  Dr Kim Mood MD,MRCP [facility]) Gastroenterology/Hepatology Pager: [phone]   Speech recognition software was used to dictate this note.

## 2020-02-21 NOTE — Telephone Encounter (Signed)
Returned patients call. LVM to call office back. 

## 2020-02-21 NOTE — Telephone Encounter (Signed)
LVM to call our office for a 6 wk f/u with Dr. Tobi Bastos

## 2020-03-05 ENCOUNTER — Ambulatory Visit: Payer: Self-pay | Admitting: Gastroenterology

## 2020-03-13 ENCOUNTER — Other Ambulatory Visit: Payer: Self-pay

## 2020-03-13 ENCOUNTER — Ambulatory Visit
Admission: RE | Admit: 2020-03-13 | Discharge: 2020-03-13 | Disposition: A | Discharge status: Home / Self Care | Payer: Self-pay | Source: Ambulatory Visit | Attending: Gastroenterology | Admitting: Gastroenterology

## 2020-03-13 DIAGNOSIS — R112 Nausea with vomiting, unspecified: Secondary | ICD-10-CM | POA: Insufficient documentation

## 2020-03-13 MED ORDER — TECHNETIUM TC 99M MEBROFENIN IV KIT
5.0000 | PACK | Freq: Once | INTRAVENOUS | Status: AC | PRN
  Administered 2020-03-13: 4.766 via INTRAVENOUS

## 2020-03-14 ENCOUNTER — Other Ambulatory Visit
Admission: RE | Admit: 2020-03-14 | Discharge: 2020-03-14 | Disposition: A | Discharge status: Home / Self Care | Payer: HRSA Program | Source: Ambulatory Visit | Attending: Gastroenterology | Admitting: Gastroenterology

## 2020-03-14 DIAGNOSIS — Z01812 Encounter for preprocedural laboratory examination: Secondary | ICD-10-CM | POA: Diagnosis present

## 2020-03-14 DIAGNOSIS — Z20822 Contact with and (suspected) exposure to covid-19: Secondary | ICD-10-CM | POA: Insufficient documentation

## 2020-03-14 LAB — SARS CORONAVIRUS 2 (TAT 6-24 HRS): SARS Coronavirus 2: NEGATIVE

## 2020-03-15 ENCOUNTER — Encounter: Payer: Self-pay | Admitting: Gastroenterology

## 2020-03-18 ENCOUNTER — Ambulatory Visit
Admission: RE | Admit: 2020-03-18 | Discharge: 2020-03-18 | Disposition: A | Discharge status: Home / Self Care | Payer: Self-pay | Attending: Gastroenterology | Admitting: Gastroenterology

## 2020-03-18 ENCOUNTER — Ambulatory Visit: Payer: Self-pay | Admitting: Anesthesiology

## 2020-03-18 ENCOUNTER — Other Ambulatory Visit: Payer: Self-pay

## 2020-03-18 ENCOUNTER — Encounter
Admission: RE | Disposition: A | Discharge status: Home / Self Care | Payer: Self-pay | Source: Home / Self Care | Attending: Gastroenterology

## 2020-03-18 DIAGNOSIS — Z79899 Other long term (current) drug therapy: Secondary | ICD-10-CM | POA: Insufficient documentation

## 2020-03-18 DIAGNOSIS — Z7952 Long term (current) use of systemic steroids: Secondary | ICD-10-CM | POA: Insufficient documentation

## 2020-03-18 DIAGNOSIS — R1013 Epigastric pain: Secondary | ICD-10-CM | POA: Insufficient documentation

## 2020-03-18 DIAGNOSIS — R109 Unspecified abdominal pain: Secondary | ICD-10-CM

## 2020-03-18 HISTORY — PX: ESOPHAGOGASTRODUODENOSCOPY (EGD) WITH PROPOFOL: SHX5813

## 2020-03-18 LAB — POCT PREGNANCY, URINE: Preg Test, Ur: NEGATIVE

## 2020-03-18 SURGERY — ESOPHAGOGASTRODUODENOSCOPY (EGD) WITH PROPOFOL
Anesthesia: General

## 2020-03-18 MED ORDER — PROPOFOL 10 MG/ML IV BOLUS
INTRAVENOUS | Status: DC | PRN
  Administered 2020-03-18: 20 mg via INTRAVENOUS
  Administered 2020-03-18: 100 mg via INTRAVENOUS

## 2020-03-18 MED ORDER — LIDOCAINE HCL (CARDIAC) PF 100 MG/5ML IV SOSY
PREFILLED_SYRINGE | INTRAVENOUS | Status: DC | PRN
  Administered 2020-03-18: 80 mg via INTRAVENOUS

## 2020-03-18 MED ORDER — MIDAZOLAM HCL 2 MG/2ML IJ SOLN
INTRAMUSCULAR | Status: AC
  Filled 2020-03-18: qty 2

## 2020-03-18 MED ORDER — SODIUM CHLORIDE 0.9 % IV SOLN
INTRAVENOUS | Status: DC
  Administered 2020-03-18: 20 mL/h via INTRAVENOUS

## 2020-03-18 MED ORDER — MIDAZOLAM HCL 2 MG/2ML IJ SOLN
INTRAMUSCULAR | Status: DC | PRN
  Administered 2020-03-18: 2 mg via INTRAVENOUS

## 2020-03-18 NOTE — Transfer of Care (Signed)
Immediate Anesthesia Transfer of Care Note  Patient: Kim Maxwell  Procedure(s) Performed: ESOPHAGOGASTRODUODENOSCOPY (EGD) WITH PROPOFOL (N/A )  Patient Location: PACU and Endoscopy Unit  Anesthesia Type:General  Level of Consciousness: awake, drowsy and patient cooperative  Airway & Oxygen Therapy: Patient Spontanous Breathing  Post-op Assessment: Report given to RN and Post -op Vital signs reviewed and stable  Post vital signs: Reviewed and stable  Last Vitals:  Vitals Value Taken Time  BP 114/73 03/18/20 1131  Temp 36.1 C 03/18/20 1131  Pulse 87 03/18/20 1134  Resp 19 03/18/20 1134  SpO2 95 % 03/18/20 1134  Vitals shown include unvalidated device data.  Last Pain:  Vitals:   03/18/20 1131  TempSrc: Temporal  PainSc: 0-No pain         Complications: No complications documented.

## 2020-03-18 NOTE — H&P (Signed)
     [name], MD [address], [address], [place], [place], [address] [address], [place], [place], [address] Phone: [phone]  Fax: [phone]  Primary Care Physician:  Santiago Bur, [place]   Pre-Procedure History & Physical: HPI:  Kim Maxwell is a 25 y.o. female is here for an endoscopy    History reviewed. No pertinent past medical history.  History reviewed. No pertinent surgical history.  Prior to Admission medications   Medication Sig Start Date End Date Taking? Authorizing Provider  ondansetron (ZOFRAN) 4 MG tablet Take 4 mg by mouth 3 (three) times daily as needed. 09/14/19  Yes [provider]  predniSONE (DELTASONE) 20 MG tablet Take 2 tablets (40 mg total) by mouth daily. 06/25/15  Yes Rockne Menghini, MD  omeprazole (PRILOSEC) 40 MG capsule Take 1 capsule (40 mg total) by mouth daily. 02/08/20 03/09/20  [name], MD    Allergies as of 02/21/2020  . (No Known Allergies)    History reviewed. No pertinent family history.  Social History   Socioeconomic History  . Marital status: Single    Spouse name: Not on file  . Number of children: Not on file  . Years of education: Not on file  . Highest education level: Not on file  Occupational History  . Not on file  Tobacco Use  . Smoking status: Never Smoker  . Smokeless tobacco: Never Used  Substance and Sexual Activity  . Alcohol use: No  . Drug use: Not on file  . Sexual activity: Not on file  Other Topics Concern  . Not on file  Social History Narrative  . Not on file   Social Determinants of Health   Financial Resource Strain: Not on file  Food Insecurity: Not on file  Transportation Needs: Not on file  Physical Activity: Not on file  Stress: Not on file  Social Connections: Not on file  Intimate Partner Violence: Not on file    Review of Systems: See HPI, otherwise negative ROS  Physical Exam: BP 128/85   Pulse 86   Temp (!) 97.3 F (36.3 C)  (Temporal)   Resp 20   Ht 4\' 10"  (1.473 m)   Wt 68 kg   LMP  (LMP Unknown) Comment: IUD in place  (Urine Preg. Negative)  SpO2 100%   BMI 31.35 kg/m  General:   Alert,  pleasant and cooperative in NAD Head:  Normocephalic and atraumatic. Neck:  Supple; no masses or thyromegaly. Lungs:  Clear throughout to auscultation, normal respiratory effort.    Heart:  +S1, +S2, Regular rate and rhythm, No edema. Abdomen:  Soft, nontender and nondistended. Normal bowel sounds, without guarding, and without rebound.   Neurologic:  Alert and  oriented x4;  grossly normal neurologically.  Impression/Plan: Kim Maxwell is here for an endoscopy  to be performed for  evaluation of abdominal pain     Risks, benefits, limitations, and alternatives regarding endoscopy have been reviewed with the patient.  Questions have been answered.  All parties agreeable.   Paulita Cradle, MD  03/18/2020, 11:12 AM

## 2020-03-18 NOTE — Anesthesia Postprocedure Evaluation (Signed)
Anesthesia Post Note  Patient: Kim Maxwell  Procedure(s) Performed: ESOPHAGOGASTRODUODENOSCOPY (EGD) WITH PROPOFOL (N/A )  Patient location during evaluation: Endoscopy Anesthesia Type: General Level of consciousness: awake and alert Pain management: pain level controlled Vital Signs Assessment: post-procedure vital signs reviewed and stable Respiratory status: spontaneous breathing, nonlabored ventilation, respiratory function stable and patient connected to nasal cannula oxygen Cardiovascular status: blood pressure returned to baseline and stable Postop Assessment: no apparent nausea or vomiting Anesthetic complications: no   No complications documented.   Last Vitals:  Vitals:   03/18/20 1141 03/18/20 1151  BP: 116/78 121/78  Pulse: 77 77  Resp: 18 20  Temp:    SpO2: 97% 99%    Last Pain:  Vitals:   03/18/20 1131  TempSrc: Temporal  PainSc: 0-No pain                 Corinda Gubler

## 2020-03-18 NOTE — Op Note (Signed)
[facility] Patient Name: Kim Maxwell Procedure Date: 03/18/2020 11:14 AM MRN: [id number] Account #: [id number] Date of Birth: 1995-09-18 Admit Type: Outpatient Age: 25 Room: [facility] ENDO ROOM 4 Gender: Female Note Status: Finalized Procedure:             Upper GI endoscopy Indications:           Dyspepsia Providers:             [name] MD, MD Medicines:             Monitored Anesthesia Care Complications:         No immediate complications. Procedure:             Pre-Anesthesia Assessment:                        - Prior to the procedure, a History and Physical was                         performed, and patient medications, allergies and                         sensitivities were reviewed. The patient's tolerance                         of previous anesthesia was reviewed.                        - The risks and benefits of the procedure and the                         sedation options and risks were discussed with the                         patient. All questions were answered and informed                         consent was obtained.                        - ASA Grade Assessment: II - A patient with mild                         systemic disease.                        After obtaining informed consent, the endoscope was                         passed under direct vision. Throughout the procedure,                         the patient's blood pressure, pulse, and oxygen                         saturations were monitored continuously. The Endoscope                         was introduced through the mouth, and advanced to the  third part of duodenum. The upper GI endoscopy was                         accomplished with ease. The patient tolerated the                         procedure well. Findings:      The esophagus was normal.      The examined duodenum was normal.      The entire examined stomach was normal. Biopsies  were taken with a cold       forceps for histology.      The cardia and gastric fundus were normal on retroflexion. Impression:            - Normal esophagus.                        - Normal examined duodenum.                        - Normal stomach. Biopsied. Recommendation:        - Await pathology results.                        - Discharge patient to home (with escort).                        - Advance diet as tolerated.                        - Continue present medications.                        - Return to my office as previously scheduled. Procedure Code(s):     --- Professional ---                        [phone], Esophagogastroduodenoscopy, flexible,                         transoral; with biopsy, single or multiple Diagnosis Code(s):     --- Professional ---                        R10.13, Epigastric pain CPT copyright 2019 American Medical Association. All rights reserved. The codes documented in this report are preliminary and upon coder review may  be revised to meet current compliance requirements. [name], MD [name] MD, MD 03/18/2020 11:25:48 AM This report has been signed electronically. Number of Addenda: 0 Note Initiated On: 03/18/2020 11:14 AM Estimated Blood Loss:  Estimated blood loss: none.      [facility]

## 2020-03-18 NOTE — Anesthesia Preprocedure Evaluation (Signed)
Anesthesia Evaluation  Patient identified by MRN, date of birth, ID band Patient awake    Reviewed: Allergy & Precautions, NPO status , Patient's Chart, lab work & pertinent test results  History of Anesthesia Complications Negative for: history of anesthetic complications  Airway Mallampati: II  TM Distance: >3 FB Neck ROM: Full    Dental no notable dental hx. (+) Teeth Intact   Pulmonary neg sleep apnea, neg COPD, Current Smoker and Patient abstained from smoking.,    Pulmonary exam normal breath sounds clear to auscultation       Cardiovascular Exercise Tolerance: Good METS(-) hypertension(-) CAD and (-) Past MI negative cardio ROS  (-) dysrhythmias  Rhythm:Regular Rate:Normal - Systolic murmurs    Neuro/Psych negative neurological ROS  negative psych ROS   GI/Hepatic GERD  ,(+)     (-) substance abuse  ,   Endo/Other  neg diabetes  Renal/GU negative Renal ROS     Musculoskeletal   Abdominal   Peds  Hematology   Anesthesia Other Findings History reviewed. No pertinent past medical history.  Reproductive/Obstetrics                             Anesthesia Physical Anesthesia Plan  ASA: II  Anesthesia Plan: General   Post-op Pain Management:    Induction: Intravenous  PONV Risk Score and Plan: 2 and Ondansetron, Propofol infusion and TIVA  Airway Management Planned: Nasal Cannula  Additional Equipment: None  Intra-op Plan:   Post-operative Plan:   Informed Consent: I have reviewed the patients History and Physical, chart, labs and discussed the procedure including the risks, benefits and alternatives for the proposed anesthesia with the patient or authorized representative who has indicated his/her understanding and acceptance.     Dental advisory given  Plan Discussed with: CRNA and Surgeon  Anesthesia Plan Comments: (Discussed risks of anesthesia with patient,  including possibility of difficulty with spontaneous ventilation under anesthesia necessitating airway intervention, PONV, and rare risks such as cardiac or respiratory or neurological events. Patient understands. Patient counseled on benefits of smoking cessation, and increased perioperative risks associated with continued smoking. )        Anesthesia Quick Evaluation

## 2020-03-19 ENCOUNTER — Encounter: Payer: Self-pay | Admitting: Gastroenterology

## 2020-03-19 LAB — SURGICAL PATHOLOGY

## 2020-03-21 ENCOUNTER — Encounter: Payer: Self-pay | Admitting: Gastroenterology

## 2020-03-28 ENCOUNTER — Encounter: Payer: Self-pay | Admitting: Gastroenterology

## 2022-05-11 ENCOUNTER — Ambulatory Visit (LOCAL_COMMUNITY_HEALTH_CENTER): Payer: Medicaid Other

## 2022-05-11 VITALS — BP 129/85 | Ht <= 58 in | Wt 153.5 lb

## 2022-05-11 DIAGNOSIS — Z3201 Encounter for pregnancy test, result positive: Secondary | ICD-10-CM | POA: Diagnosis not present

## 2022-05-11 LAB — PREGNANCY, URINE: Preg Test, Ur: POSITIVE — AB

## 2022-05-11 MED ORDER — PRENATAL 27-0.8 MG PO TABS: 1.0000 | ORAL_TABLET | Freq: Every day | ORAL | 0 refills | Status: AC

## 2022-05-11 NOTE — Progress Notes (Signed)
Client seen in nurse clinic for positive pregnancy test.   Client reports she is not currently taking any medications.  Client not sure where she plan prenatal care.  Positive pregnancy packet given to client including list of local OB/GYN providers and also reported she doesn't t  have PCP. Resources provided.     Client sent to 1st floor for presumptive medicaid.     The patient was dispensed Pre [place] Vitamins today. I provided counseling today regarding the medication. We discussed the medication, the side effects and when to call clinic. Patient given the opportunity to ask questions. Questions answered.   Bill Yohn Sherrilyn Rist, RN

## 2022-05-14 ENCOUNTER — Ambulatory Visit (INDEPENDENT_AMBULATORY_CARE_PROVIDER_SITE_OTHER): Payer: Self-pay

## 2022-05-14 VITALS — Wt 153.0 lb

## 2022-05-14 DIAGNOSIS — Z131 Encounter for screening for diabetes mellitus: Secondary | ICD-10-CM

## 2022-05-14 DIAGNOSIS — Z3480 Encounter for supervision of other normal pregnancy, unspecified trimester: Secondary | ICD-10-CM | POA: Insufficient documentation

## 2022-05-14 DIAGNOSIS — Z369 Encounter for antenatal screening, unspecified: Secondary | ICD-10-CM

## 2022-05-14 DIAGNOSIS — Z3689 Encounter for other specified antenatal screening: Secondary | ICD-10-CM

## 2022-05-14 DIAGNOSIS — O0990 Supervision of high risk pregnancy, unspecified, unspecified trimester: Secondary | ICD-10-CM | POA: Insufficient documentation

## 2022-05-14 DIAGNOSIS — Z3403 Encounter for supervision of normal first pregnancy, third trimester: Secondary | ICD-10-CM | POA: Insufficient documentation

## 2022-05-14 DIAGNOSIS — Z130 Encounter for screening for diseases of the blood and blood-forming organs and certain disorders involving the immune mechanism: Secondary | ICD-10-CM

## 2022-05-14 NOTE — Progress Notes (Signed)
New OB Intake  I connected with  Paulita Cradle on 05/14/22 at  9:15 AM EDT by telephone and verified that I am speaking with the correct person using two identifiers. Nurse is located at [facility] and pt is located at work.  I explained I am completing New OB Intake today. We discussed her EDD of 11/18/2022 that is based on LMP of 02/11/2022. Pt is G1/P0. I reviewed her allergies, medications, Medical/Surgical/OB history, and appropriate screenings. There are no cats in the home yet.  They are waiting for two in the summer.  Aware not to change the litter box.   Based on history, this is a/an pregnancy uncomplicated .   Patient Active Problem List   Diagnosis Date Noted   Supervision of other normal pregnancy, antepartum 05/14/2022    Concerns addressed today None  Delivery Plans:  Plans to deliver at [facility].  Anatomy US Explained first scheduled [place] will be scheduled soon and an anatomy scan will be done at 20 weeks. Labs Discussed genetic screening with patient. Patient desires genetic testing to be drawn with new OB labs. Discussed possible labs to be drawn at new OB appointment.  COVID Vaccine Patient has had COVID vaccine.   Social Determinants of Health Food Insecurity: denies food insecurity Transportation: Patient denies transportation needs.  First visit review I reviewed new OB appt with pt. I explained she will have ob bloodwork and pap smear/pelvic exam if indicated. Explained pt will be seen by Carie Caddy CNM at first visit; encounter routed to appropriate provider.   Loran Senters, [facility] 05/14/2022  9:37 AM

## 2022-05-14 NOTE — Patient Instructions (Signed)
Second Trimester of Pregnancy  The second trimester of pregnancy is from week 13 through week 27. This is months 4 through 6 of pregnancy. The second trimester is often a time when you feel your best. Your body has adjusted to being pregnant, and you begin to feel better physically. During the second trimester: Morning sickness has lessened or stopped completely. You may have more energy. You may have an increase in appetite. The second trimester is also a time when the unborn baby (fetus) is growing rapidly. At the end of the sixth month, the fetus may be up to 12 inches long and weigh about 1 pounds. You will likely begin to feel the baby move (quickening) between 16 and 20 weeks of pregnancy. Body changes during your second trimester Your body continues to go through many changes during your second trimester. The changes vary and generally return to normal after the baby is born. Physical changes Your weight will continue to increase. You will notice your lower abdomen bulging out. You may begin to get stretch marks on your hips, abdomen, and breasts. Your breasts will continue to grow and to become tender. Dark spots or blotches (chloasma or mask of pregnancy) may develop on your face. A dark line from your belly button to the pubic area (linea nigra) may appear. You may have changes in your hair. These can include thickening of your hair, rapid growth, and changes in texture. Some people also have hair loss during or after pregnancy, or hair that feels dry or thin. Health changes You may develop headaches. You may have heartburn. You may develop constipation. You may develop hemorrhoids or swollen, bulging veins (varicose veins). Your gums may bleed and may be sensitive to brushing and flossing. You may urinate more often because the fetus is pressing on your bladder. You may have back pain. This is caused by: Weight gain. Pregnancy hormones that are relaxing the joints in your  pelvis. A shift in weight and the muscles that support your balance. Follow these instructions at home: Medicines Follow your health care provider's instructions regarding medicine use. Specific medicines may be either safe or unsafe to take during pregnancy. Do not take any medicines unless approved by your health care provider. Take a prenatal vitamin that contains at least 600 micrograms (mcg) of folic acid. Eating and drinking Eat a healthy diet that includes fresh fruits and vegetables, whole grains, good sources of protein such as meat, eggs, or tofu, and low-fat dairy products. Avoid raw meat and unpasteurized juice, milk, and cheese. These carry germs that can harm you and your baby. You may need to take these actions to prevent or treat constipation: Drink enough fluid to keep your urine pale yellow. Eat foods that are high in fiber, such as beans, whole grains, and fresh fruits and vegetables. Limit foods that are high in fat and processed sugars, such as fried or sweet foods. Activity Exercise only as directed by your health care provider. Most people can continue their usual exercise routine during pregnancy. Try to exercise for 30 minutes at least 5 days a week. Stop exercising if you develop contractions in your uterus. Stop exercising if you develop pain or cramping in the lower abdomen or lower back. Avoid exercising if it is very hot or humid or if you are at a high altitude. Avoid heavy lifting. If you choose to, you may have sex unless your health care provider tells you not to. Relieving pain and discomfort Wear a supportive   bra to prevent discomfort from breast tenderness. Take warm sitz baths to soothe any pain or discomfort caused by hemorrhoids. Use hemorrhoid cream if your health care provider approves. Rest with your legs raised (elevated) if you have leg cramps or low back pain. If you develop varicose veins: Wear support hose as told by your health care  provider. Elevate your feet for 15 minutes, 3-4 times a day. Limit salt in your diet. Safety Wear your seat belt at all times when driving or riding in a car. Talk with your health care provider if someone is verbally or physically abusive to you. Lifestyle Do not use hot tubs, steam rooms, or saunas. Do not douche. Do not use tampons or scented sanitary pads. Avoid cat litter boxes and soil used by cats. These carry germs that can cause birth defects in the baby and possibly loss of the fetus by miscarriage or stillbirth. Do not use herbal remedies, alcohol, illegal drugs, or medicines that are not approved by your health care provider. Chemicals in these products can harm your baby. Do not use any products that contain nicotine or tobacco, such as cigarettes, e-cigarettes, and chewing tobacco. If you need help quitting, ask your health care provider. General instructions During a routine prenatal visit, your health care provider will do a physical exam and other tests. He or she will also discuss your overall health. Keep all follow-up visits. This is important. Ask your health care provider for a referral to a local prenatal education class. Ask for help if you have counseling or nutritional needs during pregnancy. Your health care provider can offer advice or refer you to specialists for help with various needs. Where to find more information American Pregnancy Association: americanpregnancy.org American College of Obstetricians and Gynecologists: acog.org/en/Womens%20Health/Pregnancy Office on Women's Health: womenshealth.gov/pregnancy Contact a health care provider if you have: A headache that does not go away when you take medicine. Vision changes or you see spots in front of your eyes. Mild pelvic cramps, pelvic pressure, or nagging pain in the abdominal area. Persistent nausea, vomiting, or diarrhea. A bad-smelling vaginal discharge or foul-smelling urine. Pain when you  urinate. Sudden or extreme swelling of your face, hands, ankles, feet, or legs. A fever. Get help right away if you: Have fluid leaking from your vagina. Have spotting or bleeding from your vagina. Have severe abdominal cramping or pain. Have difficulty breathing. Have chest pain. Have fainting spells. Have not felt your baby move for the time period told by your health care provider. Have new or increased pain, swelling, or redness in an arm or leg. Summary The second trimester of pregnancy is from week 13 through week 27 (months 4 through 6). Do not use herbal remedies, alcohol, illegal drugs, or medicines that are not approved by your health care provider. Chemicals in these products can harm your baby. Exercise only as directed by your health care provider. Most people can continue their usual exercise routine during pregnancy. Keep all follow-up visits. This is important. This information is not intended to replace advice given to you by your health care provider. Make sure you discuss any questions you have with your health care provider. Document Revised: 06/14/2019 Document Reviewed: 04/20/2019 Elsevier Patient Education  2023 [employer] First Trimester of Pregnancy  The first trimester of pregnancy starts on the first day of your last menstrual period until the end of week 12. This is also called months 1 through 3 of pregnancy. Body changes during your first trimester Your   body goes through many changes during pregnancy. The changes usually return to normal after your baby is born. Physical changes You may gain or lose weight. Your breasts may grow larger and hurt. The area around your nipples may get darker. Dark spots or blotches may develop on your face. You may have changes in your hair. Health changes You may feel like you might vomit (nauseous), and you may vomit. You may have heartburn. You may have headaches. You may have trouble pooping (constipation). Your  gums may bleed. Other changes You may get tired easily. You may pee (urinate) more often. Your menstrual periods will stop. You may not feel hungry. You may want to eat certain kinds of food. You may have changes in your emotions from day to day. You may have more dreams. Follow these instructions at home: Medicines Take over-the-counter and prescription medicines only as told by your doctor. Some medicines are not safe during pregnancy. Take a prenatal vitamin that contains at least 600 micrograms (mcg) of folic acid. Eating and drinking Eat healthy meals that include: Fresh fruits and vegetables. Whole grains. Good sources of protein, such as meat, eggs, or tofu. Low-fat dairy products. Avoid raw meat and unpasteurized juice, milk, and cheese. If you feel like you may vomit, or you vomit: Eat 4 or 5 small meals a day instead of 3 large meals. Try eating a few soda crackers. Drink liquids between meals instead of during meals. You may need to take these actions to prevent or treat trouble pooping: Drink enough fluids to keep your pee (urine) pale yellow. Eat foods that are high in fiber. These include beans, whole grains, and fresh fruits and vegetables. Limit foods that are high in fat and sugar. These include fried or sweet foods. Activity Exercise only as told by your doctor. Most people can do their usual exercise routine during pregnancy. Stop exercising if you have cramps or pain in your lower belly (abdomen) or low back. Do not exercise if it is too hot or too humid, or if you are in a place of great height (high altitude). Avoid heavy lifting. If you choose to, you may have sex unless your doctor tells you not to. Relieving pain and discomfort Wear a good support bra if your breasts are sore. Rest with your legs raised (elevated) if you have leg cramps or low back pain. If you have bulging veins (varicose veins) in your legs: Wear support hose as told by your  doctor. Raise your feet for 15 minutes, 3-4 times a day. Limit salt in your food. Safety Wear your seat belt at all times when you are in a car. Talk with your doctor if someone is hurting you or yelling at you. Talk with your doctor if you are feeling sad or have thoughts of hurting yourself. Lifestyle Do not use hot tubs, steam rooms, or saunas. Do not douche. Do not use tampons or scented sanitary pads. Do not use herbal medicines, illegal drugs, or medicines that are not approved by your doctor. Do not drink alcohol. Do not smoke or use any products that contain nicotine or tobacco. If you need help quitting, ask your doctor. Avoid cat litter boxes and soil that is used by cats. These carry germs that can cause harm to the baby and can cause a loss of your baby by miscarriage or stillbirth. General instructions Keep all follow-up visits. This is important. Ask for help if you need counseling or if you need help with   nutrition. Your doctor can give you advice or tell you where to go for help. Visit your dentist. At home, brush your teeth with a soft toothbrush. Floss gently. Write down your questions. Take them to your prenatal visits. Where to find more information American Pregnancy Association: americanpregnancy.org American College of Obstetricians and Gynecologists: www.acog.org Office on Women's Health: womenshealth.gov/pregnancy Contact a doctor if: You are dizzy. You have a fever. You have mild cramps or pressure in your lower belly. You have a nagging pain in your belly area. You continue to feel like you may vomit, you vomit, or you have watery poop (diarrhea) for 24 hours or longer. You have a bad-smelling fluid coming from your vagina. You have pain when you pee. You are exposed to a disease that spreads from person to person, such as chickenpox, measles, Zika virus, HIV, or hepatitis. Get help right away if: You have spotting or bleeding from your vagina. You have  very bad belly cramping or pain. You have shortness of breath or chest pain. You have any kind of injury, such as from a fall or a car crash. You have new or increased pain, swelling, or redness in an arm or leg. Summary The first trimester of pregnancy starts on the first day of your last menstrual period until the end of week 12 (months 1 through 3). Eat 4 or 5 small meals a day instead of 3 large meals. Do not smoke or use any products that contain nicotine or tobacco. If you need help quitting, ask your doctor. Keep all follow-up visits. This information is not intended to replace advice given to you by your health care provider. Make sure you discuss any questions you have with your health care provider. Document Revised: 06/14/2019 Document Reviewed: 04/20/2019 Elsevier Patient Education  2023 [employer] Commonly Asked Questions During Pregnancy  Cats: A parasite can be excreted in cat feces.  To avoid exposure you need to have another person empty the little box.  If you must empty the litter box you will need to wear gloves.  Wash your hands after handling your cat.  This parasite can also be found in raw or undercooked meat so this should also be avoided.  Colds, Sore Throats, Flu: Please check your medication sheet to see what you can take for symptoms.  If your symptoms are unrelieved by these medications please call the office.  Dental Work: Most any dental work your dentist recommends is permitted.  X-rays should only be taken during the first trimester if absolutely necessary.  Your abdomen should be shielded with a lead apron during all x-rays.  Please notify your provider prior to receiving any x-rays.  Novocaine is fine; gas is not recommended.  If your dentist requires a note from us prior to dental work please call the office and we will provide one for you.  Exercise: Exercise is an important part of staying healthy during your pregnancy.  You may continue most exercises  you were accustomed to prior to pregnancy.  Later in your pregnancy you will most likely notice you have difficulty with activities requiring balance like riding a bicycle.  It is important that you listen to your body and avoid activities that put you at a higher risk of falling.  Adequate rest and staying well hydrated are a must!  If you have questions about the safety of specific activities ask your provider.    Exposure to Children with illness: Try to avoid obvious exposure; report any   symptoms to us when noted,  If you have chicken pos, red measles or mumps, you should be immune to these diseases.   Please do not take any vaccines while pregnant unless you have checked with your OB provider.  Fetal Movement: After 28 weeks we recommend you do "kick counts" twice daily.  Lie or sit down in a calm quiet environment and count your baby movements "kicks".  You should feel your baby at least 10 times per hour.  If you have not felt 10 kicks within the first hour get up, walk around and have something sweet to eat or drink then repeat for an additional hour.  If count remains less than 10 per hour notify your provider.  Fumigating: Follow your pest control agent's advice as to how long to stay out of your home.  Ventilate the area well before re-entering.  Hemorrhoids:   Most over-the-counter preparations can be used during pregnancy.  Check your medication to see what is safe to use.  It is important to use a stool softener or fiber in your diet and to drink lots of liquids.  If hemorrhoids seem to be getting worse please call the office.   Hot Tubs:  Hot tubs Jacuzzis and saunas are not recommended while pregnant.  These increase your internal body temperature and should be avoided.  Intercourse:  Sexual intercourse is safe during pregnancy as long as you are comfortable, unless otherwise advised by your provider.  Spotting may occur after intercourse; report any bright red bleeding that is heavier  than spotting.  Labor:  If you know that you are in labor, please go to the hospital.  If you are unsure, please call the office and let us help you decide what to do.  Lifting, straining, etc:  If your job requires heavy lifting or straining please check with your provider for any limitations.  Generally, you should not lift items heavier than that you can lift simply with your hands and arms (no back muscles)  Painting:  Paint fumes do not harm your pregnancy, but may make you ill and should be avoided if possible.  Latex or water based paints have less odor than oils.  Use adequate ventilation while painting.  Permanents & Hair Color:  Chemicals in hair dyes are not recommended as they cause increase hair dryness which can increase hair loss during pregnancy.  " Highlighting" and permanents are allowed.  Dye may be absorbed differently and permanents may not hold as well during pregnancy.  Sunbathing:  Use a sunscreen, as skin burns easily during pregnancy.  Drink plenty of fluids; avoid over heating.  Tanning Beds:  Because their possible side effects are still unknown, tanning beds are not recommended.  Ultrasound Scans:  Routine ultrasounds are performed at approximately 20 weeks.  You will be able to see your baby's general anatomy an if you would like to know the gender this can usually be determined as well.  If it is questionable when you conceived you may also receive an ultrasound early in your pregnancy for dating purposes.  Otherwise ultrasound exams are not routinely performed unless there is a medical necessity.  Although you can request a scan we ask that you pay for it when conducted because insurance does not cover " patient request" scans.  Work: If your pregnancy proceeds without complications you may work until your due date, unless your physician or employer advises otherwise.  Round Ligament Pain/Pelvic Discomfort:  Sharp, shooting pains not associated   with bleeding are  fairly common, usually occurring in the second trimester of pregnancy.  They tend to be worse when standing up or when you remain standing for long periods of time.  These are the result of pressure of certain pelvic ligaments called "round ligaments".  Rest, Tylenol and heat seem to be the most effective relief.  As the womb and fetus grow, they rise out of the pelvis and the discomfort improves.  Please notify the office if your pain seems different than that described.  It may represent a more serious condition.  Common Medications Safe in Pregnancy  Acne:      Constipation:  Benzoyl Peroxide     Colace  Clindamycin      Dulcolax Suppository  Topica Erythromycin     Fibercon  Salicylic Acid      Metamucil         Miralax AVOID:        Senakot   Accutane    Cough:  Retin-A       Cough Drops  Tetracycline      Phenergan w/ Codeine if Rx  Minocycline      Robitussin (Plain & DM)  Antibiotics:     Crabs/Lice:  Ceclor       RID  Cephalosporins    AVOID:  E-Mycins      Kwell  Keflex  Macrobid/Macrodantin   Diarrhea:  Penicillin      Kao-Pectate  Zithromax      Imodium AD         PUSH FLUIDS AVOID:       Cipro     Fever:  Tetracycline      Tylenol (Regular or Extra  Minocycline       Strength)  Levaquin      Extra Strength-Do not          Exceed 8 tabs/24 hrs Caffeine:        <200mg/day (equiv. To 1 cup of coffee or  approx. 3 12 oz sodas)         Gas: Cold/Hayfever:       Gas-X  Benadryl      Mylicon  Claritin       Phazyme  **Claritin-D        Chlor-Trimeton    Headaches:  Dimetapp      ASA-Free Excedrin  Drixoral-Non-Drowsy     Cold Compress  Mucinex (Guaifenasin)     Tylenol (Regular or Extra  Sudafed/Sudafed-12 Hour     Strength)  **Sudafed PE Pseudoephedrine   Tylenol Cold & Sinus     Vicks Vapor Rub  Zyrtec  **AVOID if Problems With Blood Pressure         Heartburn: Avoid lying down for at least 1 hour after meals  Aciphex      Maalox     Rash:  Milk of  Magnesia     Benadryl    Mylanta       1% Hydrocortisone Cream  Pepcid  Pepcid Complete   Sleep Aids:  Prevacid      Ambien   Prilosec       Benadryl  Rolaids       Chamomile Tea  Tums (Limit 4/day)     Unisom         Tylenol PM         Warm milk-add vanilla or  Hemorrhoids:       Sugar for taste  Anusol/Anusol H.C.  (RX: Analapram 2.5%)  Sugar Substitutes:    Hydrocortisone OTC     Ok in moderation  Preparation H      Tucks        Vaseline lotion applied to tissue with wiping    Herpes:     Throat:  Acyclovir      Oragel  Famvir  Valtrex     Vaccines:         Flu Shot Leg Cramps:       *Gardasil  Benadryl      Hepatitis A         Hepatitis B Nasal Spray:       Pneumovax  Saline Nasal Spray     Polio Booster         Tetanus Nausea:       Tuberculosis test or PPD  Vitamin B6 25 mg TID   AVOID:    Dramamine      *Gardasil  Emetrol       Live Poliovirus  Ginger Root 250 mg QID    MMR (measles, mumps &  High Complex Carbs @ Bedtime    rebella)  Sea Bands-Accupressure    Varicella (Chickenpox)  Unisom 1/2 tab TID     *No known complications           If received before Pain:         Known pregnancy;   Darvocet       Resume series after  Lortab        Delivery  Percocet    Yeast:   Tramadol      Femstat  Tylenol 3      Gyne-lotrimin  Ultram       Monistat  Vicodin           MISC:         All Sunscreens           Hair Coloring/highlights          Insect Repellant's          (Including DEET)         Mystic Tans  

## 2022-05-15 ENCOUNTER — Other Ambulatory Visit: Payer: Self-pay

## 2022-05-15 ENCOUNTER — Ambulatory Visit (INDEPENDENT_AMBULATORY_CARE_PROVIDER_SITE_OTHER): Payer: Self-pay

## 2022-05-15 DIAGNOSIS — Z3687 Encounter for antenatal screening for uncertain dates: Secondary | ICD-10-CM

## 2022-05-15 DIAGNOSIS — Z3A01 Less than 8 weeks gestation of pregnancy: Secondary | ICD-10-CM

## 2022-05-15 DIAGNOSIS — Z3480 Encounter for supervision of other normal pregnancy, unspecified trimester: Secondary | ICD-10-CM

## 2022-05-15 DIAGNOSIS — Z369 Encounter for antenatal screening, unspecified: Secondary | ICD-10-CM

## 2022-05-21 ENCOUNTER — Other Ambulatory Visit (HOSPITAL_COMMUNITY)
Admission: RE | Admit: 2022-05-21 | Discharge: 2022-05-21 | Disposition: A | Discharge status: Home / Self Care | Payer: Medicaid Other | Source: Ambulatory Visit | Attending: Licensed Practical Nurse | Admitting: Licensed Practical Nurse

## 2022-05-21 ENCOUNTER — Encounter: Payer: Self-pay | Admitting: Licensed Practical Nurse

## 2022-05-21 ENCOUNTER — Ambulatory Visit: Payer: Medicaid Other | Admitting: Licensed Practical Nurse

## 2022-05-21 VITALS — BP 120/88 | HR 62 | Wt 150.8 lb

## 2022-05-21 DIAGNOSIS — Z3481 Encounter for supervision of other normal pregnancy, first trimester: Secondary | ICD-10-CM

## 2022-05-21 DIAGNOSIS — Z3480 Encounter for supervision of other normal pregnancy, unspecified trimester: Secondary | ICD-10-CM | POA: Diagnosis not present

## 2022-05-21 DIAGNOSIS — Z124 Encounter for screening for malignant neoplasm of cervix: Secondary | ICD-10-CM | POA: Diagnosis present

## 2022-05-21 DIAGNOSIS — O99210 Obesity complicating pregnancy, unspecified trimester: Secondary | ICD-10-CM | POA: Insufficient documentation

## 2022-05-21 DIAGNOSIS — Z113 Encounter for screening for infections with a predominantly sexual mode of transmission: Secondary | ICD-10-CM

## 2022-05-21 DIAGNOSIS — Z6831 Body mass index (BMI) 31.0-31.9, adult: Secondary | ICD-10-CM

## 2022-05-21 DIAGNOSIS — Z3A01 Less than 8 weeks gestation of pregnancy: Secondary | ICD-10-CM | POA: Diagnosis not present

## 2022-05-21 LAB — POCT URINALYSIS DIPSTICK
Bilirubin, UA: NEGATIVE
Blood, UA: NEGATIVE
Glucose, UA: NEGATIVE
Ketones, UA: NEGATIVE
Leukocytes, UA: NEGATIVE
Nitrite, UA: NEGATIVE
Protein, UA: NEGATIVE
Spec Grav, UA: 1.01 (ref 1.010–1.025)
Urobilinogen, UA: 1 E.U./dL
pH, UA: 6.5 (ref 5.0–8.0)

## 2022-05-21 MED ORDER — ASPIRIN 81 MG PO CHEW: 81.0000 mg | CHEWABLE_TABLET | Freq: Every day | ORAL | Status: DC

## 2022-05-21 NOTE — Progress Notes (Signed)
New Obstetric Patient H&P    Chief Complaint: "Desires prenatal care" Here with Kim Maxwell   History of Present Illness: Patient is a 27 y.o. G1P0 Hispanic or Latino female, presents with amenorrhea and positive home pregnancy test. Patient's last menstrual period was 02/11/2022 (exact date). and based on her [place] on 4/26 , her EDD is Estimated Date of Delivery: 01/08/23 and her EGA is [redacted]w[redacted]d. Cycles are irregular. She had an IUD removed about 2 years ago, her cycles became regular but hen over the holidays they have been irregular. They were not consistently using contraception. Her last pap smear was Unsure years ago and was  no pap on file  .    Since her LMP she claims she has experienced nausea, fatigue, breast tenderness, dizziness, nasal congestion, SOB. She denies vaginal bleeding. Her past medical history is noncontributory. Her prior pregnancies are notable for none  Admits to having anxiety but has never been diagnosed, has felt panicked, dealing with her family usually triggers the anxiety. She is able to quickly calm her symptoms.   Since her LMP, she admits to the use of tobacco products  no She claims she has gained    5  pounds since the start of her pregnancy.  There are cats in the home in the home  no  She admits close contact with children on a regular basis  no  She has had chicken pox in the past no She has had Tuberculosis exposures, symptoms, or previously tested positive for TB   no Current or past history of domestic violence. no  Genetic Screening/Teratology Counseling: (Includes patient, baby's father, or anyone in either family with:)   1. Patient's age >/= 33 at [facility]  no 2. Thalassemia ([place], [place], Mediterranean, or Asian background): MCV<80  no 3. Neural tube defect (meningomyelocele, spina bifida, anencephaly)  no 4. Congenital heart defect  no  5. Down syndrome  no 6. Tay-Sachs (Jewish, [place]  no 7. Canavan's Disease  no 8. Sickle cell  disease or trait (African)  no  9. Hemophilia or other blood disorders  no  10. Muscular dystrophy  no  11. Cystic fibrosis  no  12. Huntington's Chorea  no  13. Mental retardation/autism  no 14. Other inherited genetic or chromosomal disorder  no 15. Maternal metabolic disorder (DM, PKU, etc)  no 16. Patient or FOB with a child with a birth defect not listed above no  16a. Patient or FOB with a birth defect themselves no 17. Recurrent pregnancy loss, or stillbirth  no  18. Any medications since LMP other than prenatal vitamins (include vitamins, supplements, OTC meds, drugs, alcohol)  no 19. Any other genetic/environmental exposure to discuss  no  Infection History:   1. Lives with someone with TB or TB exposed  no  2. Patient or partner has history of genital herpes  no 3. Rash or viral illness since LMP  no 4. History of STI (GC, CT, HPV, syphilis, HIV)  no 5. History of recent travel :  no  Other pertinent information:  yes  Lives with Valentina Lucks, they have family near by, they get along with his family well Works for [employer] to walk and swim for exercise Plans  to breastfeed  Indications for ASA therapy (per uptodate) One of the following: Previous pregnancy with preeclampsia, especially early onset and with an adverse outcome No Multifetal gestation No Chronic hypertension No Type 1 or 2 diabetes mellitus No Chronic kidney disease No Autoimmune  disease (antiphospholipid syndrome, systemic lupus erythematosus) No  Two or more of the following: Nulliparity Yes Obesity (body mass index >30 kg/m2) Yes Family history of preeclampsia in mother or sister No Age ?35 years No Sociodemographic characteristics (African American race, low socioeconomic level) No Personal risk factors (eg, previous pregnancy with low birth weight or small for gestational age infant, previous adverse pregnancy outcome [eg, stillbirth], interval >10 years between pregnancies) No     Review of  Systems:10 point review of systems negative unless otherwise noted in HPI  Past Medical History:  Patient Active Problem List   Diagnosis Date Noted   Supervision of other normal pregnancy, antepartum 05/14/2022     Clinical Staff Provider  Office Location  [facility] Dating  Not found.  Language  English Anatomy US    Flu Vaccine  offer Genetic Screen  NIPS:   TDaP vaccine   offer Hgb A1C or  GTT Early : Third trimester :   Covid Had booster last year   LAB RESULTS   Rhogam     Blood Type     Feeding Plan breast Antibody    Contraception undecided Rubella    Circumcision undecided RPR     Pediatrician  undecided HBsAg     Support Person Griffen  HIV    Prenatal Classes yes Varicella     GBS  (For PCN allergy, check sensitivities)   BTL Consent  Hep C     VBAC Consent  Pap No results found for: "DIAGPAP"    Hgb Electro      CF      SMA             Past Surgical History:  Past Surgical History:  Procedure Laterality Date   ESOPHAGOGASTRODUODENOSCOPY (EGD) WITH PROPOFOL N/A 03/18/2020   Procedure: ESOPHAGOGASTRODUODENOSCOPY (EGD) WITH PROPOFOL;  Surgeon: [name], MD;  Location: [facility] ENDOSCOPY;  Service: Gastroenterology;  Laterality: N/A;    Gynecologic History: Patient's last menstrual period was 02/11/2022 (exact date).  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 [place]  Tobacco Use   Smoking status: Never   Smokeless tobacco: Never  Vaping Use   Vaping  Use: Never used  Substance and Sexual Activity   Alcohol use: Not Currently    Comment: ocassional maybe one per month   Drug use: Never   Sexual activity: Yes    Partners: Male    Birth control/protection: None    Comment: iud removed 2-3 years ago  Other Topics Concern   Not on file  Social History Narrative   Not on file   Social Determinants of Health   Financial Resource Strain: Medium Risk (05/14/2022)   Overall Financial Resource Strain (CARDIA)    Difficulty of Paying Living Expenses: Somewhat hard  Food Insecurity: No Food Insecurity (05/14/2022)   Hunger Vital Sign    Worried About Running Out of Food in the  Last Year: Never true    Ran Out of Food in the Last Year: Never true  Transportation Needs: No Transportation Needs (05/14/2022)   PRAPARE - Administrator, Civil Service (Medical): No    Lack of Transportation (Non-Medical): No  Physical Activity: Insufficiently Active (05/14/2022)   Exercise Vital Sign    Days of Exercise per Week: 3 days    Minutes of Exercise per Session: 20 min  Stress: No Stress Concern Present (05/14/2022)   [employer] of Occupational Health - Occupational Stress Questionnaire    Feeling of Stress : Not at all  Social Connections: Moderately Isolated (05/14/2022)   Social Connection and Isolation Panel [NHANES]    Frequency of Communication with Friends and Family: Twice a week    Frequency of Social Gatherings with Friends and Family: Twice a week    Attends Religious Services: Never    Database administrator or Organizations: No    Attends Banker Meetings: Never    Marital Status: Living with partner  Intimate Partner Violence: Not At Risk (05/14/2022)   Humiliation, Afraid, Rape, and Kick questionnaire    Fear of Current or Ex-Partner: No    Emotionally Abused: No    Physically Abused: No    Sexually Abused: No    Allergies:  No Known Allergies  Medications: Prior to Admission medications    Medication Sig Start Date End Date Taking? Authorizing Provider  Prenatal Vit-Fe Fumarate-FA (MULTIVITAMIN-PRENATAL) 27-0.8 MG TABS tablet Take 1 tablet by mouth daily at 12 noon. 05/11/22 08/19/22 Yes Federico Flake, MD  ondansetron (ZOFRAN) 4 MG tablet Take 4 mg by mouth 3 (three) times daily as needed. Patient not taking: Reported on 05/11/2022 09/14/19   [provider]  predniSONE (DELTASONE) 20 MG tablet Take 2 tablets (40 mg total) by mouth daily. Patient not taking: Reported on 05/11/2022 06/25/15   Rockne Menghini, MD    Physical Exam Vitals: Blood pressure 120/88, pulse 62, weight 150 lb 12.8 oz (68.4 kg), last menstrual period 02/11/2022.  General: NAD HEENT: normocephalic, anicteric Thyroid: no enlargement, no palpable nodules Breasts; soft, no redness or  masses, a little widely spaced. Nipples erect and intact bilaterally.  Pulmonary: No increased work of breathing, CTAB Cardiovascular: RRR, distal pulses 2+ Abdomen: NABS, soft, non-tender, non-distended.  Umbilicus without lesions.  No hepatomegaly, splenomegaly or masses palpable. No evidence of hernia  Genitourinary:  External: Normal external female genitalia.  Normal urethral meatus, normal  Bartholin's and Skene's glands.    Vagina: Normal vaginal mucosa, no evidence of prolapse.    Cervix: Grossly normal in appearance, no bleeding  Uterus: bimanual not done  Adnexa:   Rectal: deferred Extremities: no edema, erythema, or tenderness Neurologic: Grossly intact Psychiatric: mood appropriate, affect full   Assessment: 27 y.o. G1P0 at [redacted]w[redacted]d presenting to initiate prenatal care  Plan: 1) Avoid alcoholic beverages. 2) Patient encouraged not to smoke.  3) Discontinue the use of all non-medicinal drugs and chemicals.  4) Take prenatal vitamins daily.  5) Nutrition, food safety (fish, cheese advisories, and high nitrite foods) and exercise discussed. 6) Hospital and practice style discussed with cross  coverage system.  7) Genetic Screening, such as with 1st Trimester Screening, cell free fetal DNA, AFP testing, and Ultrasound, as well as with amniocentesis and CVS as appropriate, is discussed with patient. At the conclusion of today's visit patient requested genetic testing 8) Patient is asked about travel to areas at risk for the Zika virus, and  counseled to avoid travel and exposure to mosquitoes or sexual partners who may have themselves been exposed to the virus. Testing is discussed, and will be ordered as appropriate.    Pt will return at a later date for lab drawn Will start baby ASA at 12 weeks   Carie Caddy, CNM  [facility], [facility] 05/21/2022, 10:14 AM

## 2022-05-26 LAB — CYTOLOGY - PAP
Chlamydia: NEGATIVE
Comment: NEGATIVE
Comment: NORMAL
Diagnosis: NEGATIVE
Neisseria Gonorrhea: NEGATIVE

## 2022-06-12 ENCOUNTER — Other Ambulatory Visit: Payer: Self-pay

## 2022-06-12 ENCOUNTER — Other Ambulatory Visit: Payer: Medicaid Other

## 2022-06-12 ENCOUNTER — Other Ambulatory Visit (HOSPITAL_COMMUNITY)
Admission: RE | Admit: 2022-06-12 | Discharge: 2022-06-12 | Disposition: A | Discharge status: Home / Self Care | Payer: Medicaid Other | Source: Ambulatory Visit | Attending: Licensed Practical Nurse | Admitting: Licensed Practical Nurse

## 2022-06-12 DIAGNOSIS — Z369 Encounter for antenatal screening, unspecified: Secondary | ICD-10-CM

## 2022-06-12 DIAGNOSIS — Z3480 Encounter for supervision of other normal pregnancy, unspecified trimester: Secondary | ICD-10-CM

## 2022-06-12 NOTE — Addendum Note (Signed)
Addended by: Burtis Junes on: 06/12/2022 10:21 AM   Modules accepted: Orders

## 2022-06-13 LAB — URINALYSIS, ROUTINE W REFLEX MICROSCOPIC
Bilirubin, UA: NEGATIVE
Glucose, UA: NEGATIVE
Ketones, UA: NEGATIVE
Leukocytes,UA: NEGATIVE
Nitrite, UA: NEGATIVE
Protein,UA: NEGATIVE
RBC, UA: NEGATIVE
Specific Gravity, UA: 1.017 (ref 1.005–1.030)
Urobilinogen, Ur: 0.2 mg/dL (ref 0.2–1.0)
pH, UA: 7.5 (ref 5.0–7.5)

## 2022-06-13 LAB — HEMOGLOBIN A1C
Est. average glucose Bld gHb Est-mCnc: 111 mg/dL
Hgb A1c MFr Bld: 5.5 % (ref 4.8–5.6)

## 2022-06-14 LAB — URINE CULTURE, OB REFLEX

## 2022-06-14 LAB — CULTURE, OB URINE

## 2022-06-16 LAB — MONITOR DRUG PROFILE 14(MW)
Amphetamine Scrn, Ur: NEGATIVE ng/mL
BARBITURATE SCREEN URINE: NEGATIVE ng/mL
BENZODIAZEPINE SCREEN, URINE: NEGATIVE ng/mL
Buprenorphine, Urine: NEGATIVE ng/mL
CANNABINOIDS UR QL SCN: NEGATIVE ng/mL
Cocaine (Metab) Scrn, Ur: NEGATIVE ng/mL
Creatinine(Crt), U: 75.2 mg/dL (ref 20.0–300.0)
Fentanyl, Urine: NEGATIVE pg/mL
Meperidine Screen, Urine: NEGATIVE ng/mL
Methadone Screen, Urine: NEGATIVE ng/mL
OXYCODONE+OXYMORPHONE UR QL SCN: NEGATIVE ng/mL
Opiate Scrn, Ur: NEGATIVE ng/mL
Ph of Urine: 7.2 (ref 4.5–8.9)
Phencyclidine Qn, Ur: NEGATIVE ng/mL
Propoxyphene Scrn, Ur: NEGATIVE ng/mL
SPECIFIC GRAVITY: 1.016
Tramadol Screen, Urine: NEGATIVE ng/mL

## 2022-06-16 LAB — URINE CYTOLOGY ANCILLARY ONLY
Chlamydia: NEGATIVE
Comment: NEGATIVE
Comment: NORMAL
Neisseria Gonorrhea: NEGATIVE

## 2022-06-16 LAB — NICOTINE SCREEN, URINE: Cotinine Ql Scrn, Ur: NEGATIVE ng/mL

## 2022-06-17 LAB — CBC/D/PLT+RPR+RH+ABO+RUBIGG...
Antibody Screen: NEGATIVE
Basophils Absolute: 0 10*3/uL (ref 0.0–0.2)
Basos: 1 %
EOS (ABSOLUTE): 0.1 10*3/uL (ref 0.0–0.4)
Eos: 1 %
HCV Ab: NONREACTIVE
HIV Screen 4th Generation wRfx: NONREACTIVE
Hematocrit: 37.6 % (ref 34.0–46.6)
Hemoglobin: 12.6 g/dL (ref 11.1–15.9)
Hepatitis B Surface Ag: NEGATIVE
Immature Grans (Abs): 0 10*3/uL (ref 0.0–0.1)
Immature Granulocytes: 0 %
Lymphocytes Absolute: 1.7 10*3/uL (ref 0.7–3.1)
Lymphs: 27 %
MCH: 29.4 pg (ref 26.6–33.0)
MCHC: 33.5 g/dL (ref 31.5–35.7)
MCV: 88 fL (ref 79–97)
Monocytes Absolute: 0.3 10*3/uL (ref 0.1–0.9)
Monocytes: 5 %
Neutrophils Absolute: 4.3 10*3/uL (ref 1.4–7.0)
Neutrophils: 66 %
Platelets: 267 10*3/uL (ref 150–450)
RBC: 4.28 x10E6/uL (ref 3.77–5.28)
RDW: 12.5 % (ref 11.7–15.4)
RPR Ser Ql: NONREACTIVE
Rh Factor: POSITIVE
Rubella Antibodies, IGG: <0.9 index — ABNORMAL LOW (ref >0.99)
Varicella zoster IgG: <135 index — ABNORMAL LOW (ref >165)
WBC: 6.4 10*3/uL (ref 3.4–10.8)

## 2022-06-17 LAB — HGB FRACTIONATION CASCADE
Hgb A2: 2.7 % (ref 1.8–3.2)
Hgb A: 97.3 % (ref 96.4–98.8)
Hgb F: 0 % (ref 0.0–2.0)
Hgb S: 0 %

## 2022-06-17 LAB — HCV INTERPRETATION

## 2022-06-18 ENCOUNTER — Ambulatory Visit (INDEPENDENT_AMBULATORY_CARE_PROVIDER_SITE_OTHER): Payer: Medicaid Other | Admitting: Obstetrics

## 2022-06-18 ENCOUNTER — Encounter: Payer: Self-pay | Admitting: Obstetrics

## 2022-06-18 VITALS — BP 125/84 | HR 77 | Wt 149.0 lb

## 2022-06-18 DIAGNOSIS — Z3481 Encounter for supervision of other normal pregnancy, first trimester: Secondary | ICD-10-CM

## 2022-06-18 DIAGNOSIS — Z3A10 10 weeks gestation of pregnancy: Secondary | ICD-10-CM

## 2022-06-18 DIAGNOSIS — Z3480 Encounter for supervision of other normal pregnancy, unspecified trimester: Secondary | ICD-10-CM

## 2022-06-18 LAB — POCT URINALYSIS DIPSTICK OB
Bilirubin, UA: NEGATIVE
Blood, UA: NEGATIVE
Clarity, UA: NEGATIVE
Glucose, UA: NEGATIVE
Ketones, UA: NEGATIVE
Leukocytes, UA: NEGATIVE
Nitrite, UA: NEGATIVE
POC,PROTEIN,UA: NEGATIVE
Spec Grav, UA: 1.02 (ref 1.010–1.025)
Urobilinogen, UA: 0.2 E.U./dL
pH, UA: 7 (ref 5.0–8.0)

## 2022-06-18 NOTE — Progress Notes (Signed)
ROB at [redacted]w[redacted]d. Kim Maxwell is feeling nausea in waves. She is managing it with peppermint oil and hugs from her husband - has not needed Zofran. Her food aversions have started to improve. She is taking her PNV when she can. Will start ASA in 2nd trimester. Encouraged healthy eating habits and regular activity. Anticipatory guidance about 1st and 2nd trimester. They are interested in water birth. Reviewed basic; discussed limited provider availability for water birth. Link to water birth class sent via MyChart. Unable to hear FHT today; offered return in 1 week for [facility] or schedule [place]. [place] scheduled for tomorrow. ROB in 4 weeks.  Glenetta Borg, CNM

## 2022-06-19 ENCOUNTER — Ambulatory Visit
Admission: RE | Admit: 2022-06-19 | Discharge: 2022-06-19 | Disposition: A | Discharge status: Home / Self Care | Payer: Medicaid Other | Source: Ambulatory Visit | Attending: Obstetrics | Admitting: Obstetrics

## 2022-06-19 DIAGNOSIS — O26891 Other specified pregnancy related conditions, first trimester: Secondary | ICD-10-CM | POA: Insufficient documentation

## 2022-06-19 DIAGNOSIS — Z3A10 10 weeks gestation of pregnancy: Secondary | ICD-10-CM | POA: Insufficient documentation

## 2022-06-19 LAB — MATERNIT 21 PLUS CORE, BLOOD
Fetal Fraction: 7
Result (T21): NEGATIVE
Trisomy 13 (Patau syndrome): NEGATIVE
Trisomy 18 (Edwards syndrome): NEGATIVE
Trisomy 21 (Down syndrome): NEGATIVE

## 2022-06-21 ENCOUNTER — Encounter: Payer: Self-pay | Admitting: Obstetrics

## 2022-07-16 ENCOUNTER — Ambulatory Visit (INDEPENDENT_AMBULATORY_CARE_PROVIDER_SITE_OTHER): Payer: Medicaid Other | Admitting: Obstetrics

## 2022-07-16 VITALS — BP 114/64 | Wt 150.0 lb

## 2022-07-16 DIAGNOSIS — Z3480 Encounter for supervision of other normal pregnancy, unspecified trimester: Secondary | ICD-10-CM

## 2022-07-16 DIAGNOSIS — Z3A14 14 weeks gestation of pregnancy: Secondary | ICD-10-CM

## 2022-07-16 DIAGNOSIS — Z3482 Encounter for supervision of other normal pregnancy, second trimester: Secondary | ICD-10-CM

## 2022-07-16 LAB — POCT URINALYSIS DIPSTICK OB
Bilirubin, UA: NEGATIVE
Blood, UA: NEGATIVE
Glucose, UA: NEGATIVE
Ketones, UA: NEGATIVE
Nitrite, UA: NEGATIVE
POC,PROTEIN,UA: NEGATIVE
Urobilinogen, UA: 0.2 E.U./dL
pH, UA: 7.5 (ref 5.0–8.0)

## 2022-07-16 NOTE — Progress Notes (Signed)
Routine Prenatal Care Visit  Subjective  Kim Maxwell is a 27 y.o. G1P0 at [redacted]w[redacted]d being seen today for ongoing prenatal care.  She is currently monitored for the following issues for this low-risk pregnancy and has Supervision of other normal pregnancy, antepartum and BMI 31.0-31.9,adult on their problem list.  ----------------------------------------------------------------------------------- Patient reports headache and sciatic pain has not yet tried tylenol .Her nausea is abating, but now the headaches are frequent.    . Vag. Bleeding: None.   . Leaking Fluid denies.  ----------------------------------------------------------------------------------- The following portions of the patient's history were reviewed and updated as appropriate: allergies, current medications, past family history, past medical history, past social history, past surgical history and problem list. Problem list updated.  Objective  Blood pressure 114/64, weight 150 lb (68 kg), last menstrual period 02/11/2022. Pregravid weight 145 lb (65.8 kg) Total Weight Gain 5 lb (2.268 kg) Urinalysis: Urine Protein    Urine Glucose    Fetal Status: Fetal Heart Rate (bpm): 154         General:  Alert, oriented and cooperative. Patient is in no acute distress.  Skin: Skin is warm and dry. No rash noted.   Cardiovascular: Normal heart rate noted  Respiratory: Normal respiratory effort, no problems with respiration noted  Abdomen: Soft, gravid, appropriate for gestational age. Pain/Pressure: Absent     Pelvic:  Cervical exam deferred        Extremities: Normal range of motion.  Edema: None  Mental Status: Normal mood and affect. Normal behavior. Normal judgment and thought content.   Assessment   27 y.o. G1P0 at [redacted]w[redacted]d by  01/08/2023, by Ultrasound presenting for routine prenatal visit  Plan   first Problems (from 05/14/22 to present)     Problem Noted Resolved   BMI 31.0-31.9,adult 05/21/2022 by Ellwood Sayers, CNM  No   Supervision of other normal pregnancy, antepartum 05/14/2022 by Loran Senters, CMA No   Overview Addendum 07/16/2022  9:22 AM by Mirna Mires, CNM     Clinical Staff Provider  Office Location  [facility] Dating  Not found.  Language  English Anatomy US    Flu Vaccine  offer Genetic Screen  NIPS:   TDaP vaccine   offer Hgb A1C or  GTT Early : Third trimester :   Covid Had booster last year   LAB RESULTS   Rhogam  O/Positive/-- (05/24 1610)  Blood Type O/Positive/-- (05/24 9604)   Feeding Plan breast Antibody Negative (05/24 0922)  Contraception undecided Rubella <0.90 (05/24 5409)  Circumcision undecided RPR Non Reactive (05/24 0922)   Pediatrician  undecided HBsAg Negative (05/24 0922)   Support Person Griffen  HIV Non Reactive (05/24 8119)  Prenatal Classes yes Varicella Non immune    GBS  (For PCN allergy, check sensitivities)   BTL Consent  Hep C Non Reactive (05/24 1478)   VBAC Consent  Pap Diagnosis  Date Value Ref Range Status  05/21/2022   Final   - Negative for intraepithelial lesion or malignancy (NILM)      Hgb Electro      CF      SMA                   Preterm labor symptoms and general obstetric precautions including but not limited to vaginal bleeding, contractions, leaking of fluid and fetal movement were reviewed in detail with the patient. Please refer to After Visit Summary for other counseling recommendations.  She can take Tylenol for headaches.  For sciatica,  can try Chiropractic, , yoga stretches, massage.  Return in about 4 weeks (around 08/13/2022) for , ROOB and anatomy scan.  Mirna Mires, CNM  07/16/2022 9:37 AM

## 2022-07-16 NOTE — Addendum Note (Signed)
Addended by: Donnetta Hail on: 07/16/2022 09:46 AM   Modules accepted: Orders

## 2022-08-13 ENCOUNTER — Ambulatory Visit (INDEPENDENT_AMBULATORY_CARE_PROVIDER_SITE_OTHER): Payer: Medicaid Other

## 2022-08-13 ENCOUNTER — Ambulatory Visit (INDEPENDENT_AMBULATORY_CARE_PROVIDER_SITE_OTHER): Payer: Medicaid Other | Admitting: Obstetrics

## 2022-08-13 VITALS — BP 118/81 | HR 83 | Wt 152.0 lb

## 2022-08-13 DIAGNOSIS — Z3689 Encounter for other specified antenatal screening: Secondary | ICD-10-CM | POA: Diagnosis not present

## 2022-08-13 DIAGNOSIS — Z3A19 19 weeks gestation of pregnancy: Secondary | ICD-10-CM

## 2022-08-13 DIAGNOSIS — Z3480 Encounter for supervision of other normal pregnancy, unspecified trimester: Secondary | ICD-10-CM

## 2022-08-13 LAB — POCT URINALYSIS DIPSTICK OB
Bilirubin, UA: NEGATIVE
Blood, UA: NEGATIVE
Glucose, UA: NEGATIVE
Ketones, UA: NEGATIVE
Leukocytes, UA: NEGATIVE
Nitrite, UA: NEGATIVE
POC,PROTEIN,UA: NEGATIVE
Spec Grav, UA: 1.015 (ref 1.010–1.025)
Urobilinogen, UA: 0.2 E.U./dL
pH, UA: 7 (ref 5.0–8.0)

## 2022-08-13 NOTE — Progress Notes (Signed)
    Return Prenatal Note   Assessment/Plan   Plan  27 y.o. G1P0 at [redacted]w[redacted]d presents for follow-up OB visit. Reviewed prenatal record including previous visit note.  Supervision of other normal pregnancy, antepartum -Normal anatomy [place] today -Completed water birth class. Planning of CBE and BF -Anticipatory guidance about 2nd trimester -Encouraged to select pediatrician    No orders of the defined types were placed in this encounter.  Return in about 4 weeks (around 09/10/2022).   Future Appointments  Date Time Provider Department Center  08/13/2022 10:15 AM Glenetta Borg, CNM [facility] None  09/10/2022  9:55 AM Free, Lindalou Hose, CNM [facility] None    For next visit:  continue with routine prenatal care     Subjective   27 y.o. G1P0 at [redacted]w[redacted]d presents for this follow-up prenatal visit.  Nafeesah is doing well. She had her anatomy US today. Preliminary report appears normal. She is feeling flutters from the baby. Her nausea has improved but is not gone completely. She takes her PNV and ASA most days.  Movement: Present  Objective   Flow sheet Vitals: Pulse Rate: 83 BP: 118/81 Fundal Height: 19 cm Fetal Heart Rate (bpm): 156 Total weight gain: 7 lb (3.175 kg)  General Appearance  No acute distress, well appearing, and well nourished Pulmonary   Normal work of breathing Neurologic   Alert and oriented to person, place, and time Psychiatric   Mood and affect within normal limits  [name], CNM 08/13/22 9:06 AM

## 2022-08-13 NOTE — Assessment & Plan Note (Signed)
-  Normal anatomy [place] today -Completed water birth class. Planning of CBE and BF -Anticipatory guidance about 2nd trimester -Encouraged to select pediatrician

## 2022-08-17 ENCOUNTER — Other Ambulatory Visit: Payer: Self-pay | Admitting: Obstetrics

## 2022-08-17 DIAGNOSIS — Z3480 Encounter for supervision of other normal pregnancy, unspecified trimester: Secondary | ICD-10-CM

## 2022-08-19 ENCOUNTER — Other Ambulatory Visit: Payer: Self-pay | Admitting: Obstetrics

## 2022-08-19 DIAGNOSIS — Z3480 Encounter for supervision of other normal pregnancy, unspecified trimester: Secondary | ICD-10-CM

## 2022-09-07 ENCOUNTER — Ambulatory Visit (INDEPENDENT_AMBULATORY_CARE_PROVIDER_SITE_OTHER): Payer: Medicaid Other | Admitting: Licensed Practical Nurse

## 2022-09-07 ENCOUNTER — Encounter: Payer: Self-pay | Admitting: Licensed Practical Nurse

## 2022-09-07 ENCOUNTER — Ambulatory Visit: Payer: Medicaid Other

## 2022-09-07 VITALS — BP 131/83 | HR 78 | Wt 160.2 lb

## 2022-09-07 DIAGNOSIS — Z3482 Encounter for supervision of other normal pregnancy, second trimester: Secondary | ICD-10-CM

## 2022-09-07 DIAGNOSIS — Z3A22 22 weeks gestation of pregnancy: Secondary | ICD-10-CM

## 2022-09-07 DIAGNOSIS — Z3480 Encounter for supervision of other normal pregnancy, unspecified trimester: Secondary | ICD-10-CM

## 2022-09-07 DIAGNOSIS — Z362 Encounter for other antenatal screening follow-up: Secondary | ICD-10-CM | POA: Diagnosis not present

## 2022-09-07 LAB — POCT URINALYSIS DIPSTICK
Bilirubin, UA: NEGATIVE
Blood, UA: NEGATIVE
Glucose, UA: NEGATIVE
Ketones, UA: NEGATIVE
Leukocytes, UA: NEGATIVE
Nitrite, UA: NEGATIVE
Protein, UA: NEGATIVE
Spec Grav, UA: 1.015 (ref 1.010–1.025)
Urobilinogen, UA: 0.2 E.U./dL
pH, UA: 6.5 (ref 5.0–8.0)

## 2022-09-07 NOTE — Progress Notes (Signed)
Routine Prenatal Care Visit  Subjective  Kim Maxwell is a 27 y.o. G1P0 at [redacted]w[redacted]d being seen today for ongoing prenatal care.  She is currently monitored for the following issues for this low-risk pregnancy and has Supervision of other normal pregnancy, antepartum and BMI 31.0-31.9,adult on their problem list.  ----------------------------------------------------------------------------------- Patient reports heartburn using Calcium carbonate Here with her partner, they will sign up for CBE and BF classes.  Mood has been good Pt asked about her weight gain, TWG 15lbs, she is pretty active at work. Encouraged pt to remain active and continue a health diet.  -Fu anatomy US done today, normal anatomy   Contractions: Not present. Vag. Bleeding: None.  Movement: Present. Leaking Fluid denies.  ----------------------------------------------------------------------------------- The following portions of the patient's history were reviewed and updated as appropriate: allergies, current medications, past family history, past medical history, past social history, past surgical history and problem list. Problem list updated.  Objective  Blood pressure 131/83, pulse 78, weight 160 lb 3.2 oz (72.7 kg), last menstrual period 02/11/2022. Pregravid weight 145 lb (65.8 kg) Total Weight Gain 15 lb 3.2 oz (6.895 kg) Urinalysis: Urine Protein    Urine Glucose    Fetal Status: Fetal Heart Rate (bpm): 150   Movement: Present     General:  Alert, oriented and cooperative. Patient is in no acute distress.  Skin: Skin is warm and dry. No rash noted.   Cardiovascular: Normal heart rate noted  Respiratory: Normal respiratory effort, no problems with respiration noted  Abdomen: Soft, gravid, appropriate for gestational age. Pain/Pressure: Present     Pelvic:  Cervical exam deferred        Extremities: Normal range of motion.  Edema: Trace  Mental Status: Normal mood and affect. Normal behavior. Normal judgment  and thought content.   Assessment   27 y.o. G1P0 at [redacted]w[redacted]d by  01/08/2023, by Ultrasound presenting for routine prenatal visit  Plan   first Problems (from 05/14/22 to present)     Problem Noted Resolved   BMI 31.0-31.9,adult 05/21/2022 by Ellwood Sayers, CNM No   Supervision of other normal pregnancy, antepartum 05/14/2022 by Loran Senters, CMA No   Overview Addendum 08/17/2022  5:44 PM by Mirna Mires, CNM     Clinical Staff Provider  Office Location  [facility] Dating  Not found.  Language  English Anatomy US  Incomplete at 19 wks- repeat ordered.  Flu Vaccine  offer Genetic Screen  NIPS:   TDaP vaccine   offer Hgb A1C or  GTT Early : Third trimester :   Covid Had booster last year   LAB RESULTS   Rhogam  O/Positive/-- (05/24 1610)  Blood Type O/Positive/-- (05/24 9604)   Feeding Plan breast Antibody Negative (05/24 0922)  Contraception undecided Rubella <0.90 (05/24 5409)  Circumcision undecided RPR Non Reactive (05/24 0922)   Pediatrician  undecided HBsAg Negative (05/24 0922)   Support Person Griffen  HIV Non Reactive (05/24 8119)  Prenatal Classes yes Varicella Non immune    GBS  (For PCN allergy, check sensitivities)   BTL Consent  Hep C Non Reactive (05/24 0922)   VBAC Consent  Pap Diagnosis  Date Value Ref Range Status  05/21/2022   Final   - Negative for intraepithelial lesion or malignancy (NILM)      Hgb Electro      CF      SMA  Preterm labor symptoms and general obstetric precautions including but not limited to vaginal bleeding, contractions, leaking of fluid and fetal movement were reviewed in detail with the patient. Please refer to After Visit Summary for other counseling recommendations.   Return in about 4 weeks (around 10/05/2022) for ROB.  Carie Caddy, CNM  [facility]  09/07/22  11:29 AM

## 2022-09-08 ENCOUNTER — Other Ambulatory Visit: Payer: Medicaid Other

## 2022-09-22 IMAGING — NM NM HEPATO W/GB/PHARM/[PERSON_NAME]
2 series · 12 of 12 positions shown · non-contrast
Comparison: CT 11/30/2018

CLINICAL DATA: Nausea and vomiting

EXAM:
NUCLEAR MEDICINE HEPATOBILIARY IMAGING WITH GALLBLADDER EF
TECHNIQUE: Sequential images of the abdomen were obtained [DATE] minutes
following intravenous administration of radiopharmaceutical. After
oral ingestion of Ensure, gallbladder ejection fraction was
determined. At 60 min, normal ejection fraction is greater than 33%.
RADIOPHARMACEUTICALS:  4.766 mCi Jc-CCm  Choletec IV

[Series 1000: hepatobiliary scan · 9.59mm/px · 6 of 60 frames shown]
[frame 6/60]
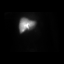
[frame 16/60]
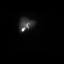
[frame 26/60]
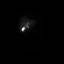
[frame 36/60]
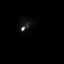
[frame 46/60]
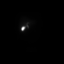
[frame 56/60]
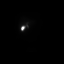

[Series 1000: gallbladder ef · 4.80mm/px · 6 of 120 frames shown]
[frame 11/120]
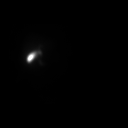
[frame 31/120]
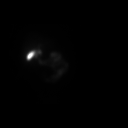
[frame 51/120]
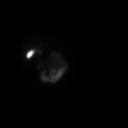
[frame 71/120]
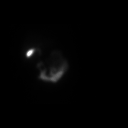
[frame 91/120]
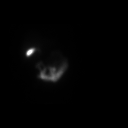
[frame 111/120]
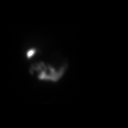

[12 of 12 positions shown; findings below may reference images not displayed]

FINDINGS: Prompt uptake and biliary excretion of activity by the liver is
seen. Gallbladder activity is visualized, consistent with patency of
cystic duct. Biliary activity passes into small bowel, consistent
with patent common bile duct.

Calculated gallbladder ejection fraction is 58%. (Normal gallbladder
ejection fraction with Ensure is greater than 33%.)
IMPRESSION: Negative examination

## 2022-10-05 ENCOUNTER — Encounter: Payer: Self-pay | Admitting: Obstetrics

## 2022-10-05 ENCOUNTER — Ambulatory Visit (INDEPENDENT_AMBULATORY_CARE_PROVIDER_SITE_OTHER): Payer: Medicaid Other | Admitting: Obstetrics

## 2022-10-05 VITALS — BP 123/85 | HR 88 | Wt 164.0 lb

## 2022-10-05 DIAGNOSIS — Z3480 Encounter for supervision of other normal pregnancy, unspecified trimester: Secondary | ICD-10-CM

## 2022-10-05 DIAGNOSIS — Z131 Encounter for screening for diabetes mellitus: Secondary | ICD-10-CM

## 2022-10-05 DIAGNOSIS — Z113 Encounter for screening for infections with a predominantly sexual mode of transmission: Secondary | ICD-10-CM

## 2022-10-05 DIAGNOSIS — D649 Anemia, unspecified: Secondary | ICD-10-CM

## 2022-10-05 NOTE — Assessment & Plan Note (Addendum)
-  CBE scheduled for next week. Encouraged BF class. -Discussed 28-week labs. Alternate glucose list given. -Reviewed s/s of PTL and fetal kick counts

## 2022-10-05 NOTE — Progress Notes (Signed)
    Return Prenatal Note   Assessment/Plan   Plan  27 y.o. G1P0 at [redacted]w[redacted]d presents for follow-up OB visit. Reviewed prenatal record including previous visit note.  Supervision of other normal pregnancy, antepartum -CBE scheduled for next week. Encouraged BF class. -Discussed 28-week labs. Alternate glucose list given. -Reviewed s/s of PTL and fetal kick counts   Orders Placed This Encounter  Procedures   28 Week RH+Panel    Standing Status:   Future    Standing Expiration Date:   10/05/2023   No follow-ups on file.   Future Appointments  Date Time Provider Department Center  10/19/2022  8:20 AM [facility] None  10/19/2022  8:55 AM Kim Maxwell, CNM [facility] None    For next visit:  ROB with 1 hour gluocla, third trimester labs, and Tdap     Subjective   Kim Maxwell is still having occasional nausea at work. She would like to know the alternate options for glucose testing. She is getting ready for the baby and has scheduled CBE. Baby is very active.  Movement: Present Contractions: Not present  Objective   Flow sheet Vitals: Pulse Rate: 88 BP: 123/85 Fundal Height: 27 cm Fetal Heart Rate (bpm): 138 Total weight gain: 19 lb (8.618 kg)  General Appearance  No acute distress, well appearing, and well nourished Pulmonary   Normal work of breathing Neurologic   Alert and oriented to person, place, and time Psychiatric   Mood and affect within normal limits  Kim Maxwell, CNM 10/05/22

## 2022-10-13 ENCOUNTER — Encounter: Payer: Self-pay | Admitting: Obstetrics

## 2022-10-14 DIAGNOSIS — Z0289 Encounter for other administrative examinations: Secondary | ICD-10-CM

## 2022-10-19 ENCOUNTER — Other Ambulatory Visit: Payer: Medicaid Other

## 2022-10-19 ENCOUNTER — Encounter: Payer: Self-pay | Admitting: Obstetrics

## 2022-10-19 ENCOUNTER — Ambulatory Visit (INDEPENDENT_AMBULATORY_CARE_PROVIDER_SITE_OTHER): Payer: Medicaid Other | Admitting: Obstetrics

## 2022-10-19 VITALS — BP 130/87 | HR 90 | Wt 166.0 lb

## 2022-10-19 DIAGNOSIS — Z3480 Encounter for supervision of other normal pregnancy, unspecified trimester: Secondary | ICD-10-CM

## 2022-10-19 DIAGNOSIS — Z113 Encounter for screening for infections with a predominantly sexual mode of transmission: Secondary | ICD-10-CM

## 2022-10-19 DIAGNOSIS — Z3A28 28 weeks gestation of pregnancy: Secondary | ICD-10-CM

## 2022-10-19 DIAGNOSIS — D649 Anemia, unspecified: Secondary | ICD-10-CM

## 2022-10-19 DIAGNOSIS — Z131 Encounter for screening for diabetes mellitus: Secondary | ICD-10-CM

## 2022-10-19 NOTE — Progress Notes (Signed)
    Return Prenatal Note   Assessment/Plan   Plan  27 y.o. G1P0 at [redacted]w[redacted]d presents for follow-up OB visit. Reviewed prenatal record including previous visit note.  -28 week labs drawn today including GTT, CBC, and RPR. This will help determine if fatigue is being caused something that needs to be addressed such as anemia or hypoglycemia.  -She was educated that carpal tunnel is common in pregnancy and could be causing her hand numbness. Advised to wear wrist braces at night and will send hand/wrist stretches to try.  -Discussed water birth and will send additional information about tub rentals. They had no additional questions regarding birth plan.  -Discussed importance of regulating periods with PCOS after pregnancy to avoid endometrial hyperplasia. Patient was able to do this with diet and exercise before pregnancy, so current plan is to continue that. If this doesn't work, patient was advised to reconsider birth control options.     No orders of the defined types were placed in this encounter.  Return in about 2 weeks (around 11/02/2022).   Future Appointments  Date Time Provider Department Center  11/02/2022  8:55 AM Julieanne Manson, MD [facility] None    For next visit:  continue with routine prenatal care     Subjective   Patient is doing well today. She has been experiencing nausea in the mornings but it is tolerable without medication. She denies vomiting. She reports new onset of numbness in both of her hands that started this past week. She also reports episodes of fatigue that are infrequent but the fatigue is extreme. She is interested in doing a water birth and would like more information on tub rentals. She and her partner took the water birth class and feel prepared for this birth plan. When asked about thoughts on contraception after pregnancy she discussed not tolerating birth control in the past. She has tried OCPs and an IUD and didn't tolerate either. She does have a  history of PCOS that she was able to manage with diet and exercise. She is not opposed to birth control in the future, but would like to find alternative methods it if possible.   Movement: Present Contractions: Irritability  Objective   Flow sheet Vitals: Pulse Rate: 90 BP: 130/87 Fundal Height: 27 cm Fetal Heart Rate (bpm): 145 Total weight gain: 21 lb (9.526 kg)  General Appearance  No acute distress, well appearing, and well nourished Pulmonary   Normal work of breathing Neurologic   Alert and oriented to person, place, and time Psychiatric   Mood and affect within normal limits  This note was written by Darci Current, PA student. I was present for the discussion with the patient and physical portion of the exam.  [name], CNM 10/19/22 9:59 AM

## 2022-10-20 ENCOUNTER — Encounter: Payer: Self-pay | Admitting: Obstetrics

## 2022-10-20 LAB — 28 WEEK RH+PANEL
Basophils Absolute: 0 10*3/uL (ref 0.0–0.2)
Basos: 0 %
EOS (ABSOLUTE): 0.1 10*3/uL (ref 0.0–0.4)
Eos: 1 %
Gestational Diabetes Screen: 100 mg/dL (ref 70–139)
HIV Screen 4th Generation wRfx: NONREACTIVE
Hematocrit: 39.4 % (ref 34.0–46.6)
Hemoglobin: 13.5 g/dL (ref 11.1–15.9)
Immature Grans (Abs): 0 10*3/uL (ref 0.0–0.1)
Immature Granulocytes: 0 %
Lymphocytes Absolute: 1.8 10*3/uL (ref 0.7–3.1)
Lymphs: 21 %
MCH: 30.1 pg (ref 26.6–33.0)
MCHC: 34.3 g/dL (ref 31.5–35.7)
MCV: 88 fL (ref 79–97)
Monocytes Absolute: 0.6 10*3/uL (ref 0.1–0.9)
Monocytes: 6 %
Neutrophils Absolute: 6.4 10*3/uL (ref 1.4–7.0)
Neutrophils: 72 %
Platelets: 258 10*3/uL (ref 150–450)
RBC: 4.49 x10E6/uL (ref 3.77–5.28)
RDW: 12 % (ref 11.7–15.4)
RPR Ser Ql: NONREACTIVE
WBC: 9 10*3/uL (ref 3.4–10.8)

## 2022-11-02 ENCOUNTER — Ambulatory Visit (INDEPENDENT_AMBULATORY_CARE_PROVIDER_SITE_OTHER): Payer: Medicaid Other

## 2022-11-02 VITALS — BP 125/85 | HR 85 | Wt 166.0 lb

## 2022-11-02 DIAGNOSIS — Z6831 Body mass index (BMI) 31.0-31.9, adult: Secondary | ICD-10-CM

## 2022-11-02 DIAGNOSIS — Z2839 Other underimmunization status: Secondary | ICD-10-CM | POA: Insufficient documentation

## 2022-11-02 DIAGNOSIS — Z3A30 30 weeks gestation of pregnancy: Secondary | ICD-10-CM

## 2022-11-02 DIAGNOSIS — Z3483 Encounter for supervision of other normal pregnancy, third trimester: Secondary | ICD-10-CM

## 2022-11-02 DIAGNOSIS — O09899 Supervision of other high risk pregnancies, unspecified trimester: Secondary | ICD-10-CM | POA: Insufficient documentation

## 2022-11-02 DIAGNOSIS — Z23 Encounter for immunization: Secondary | ICD-10-CM | POA: Diagnosis not present

## 2022-11-02 DIAGNOSIS — Z3480 Encounter for supervision of other normal pregnancy, unspecified trimester: Secondary | ICD-10-CM

## 2022-11-02 NOTE — Progress Notes (Signed)
    Return Prenatal Note   Assessment/Plan   Plan  27 y.o. G1P0 at [redacted]w[redacted]d presents for follow-up OB visit. Reviewed prenatal record including previous visit note.  Supervision of other normal pregnancy, antepartum - Flu and tdap vaccinations today in clinic. - Discussed contraception options, planning to use condoms.  - Provided reassurance for return of pregnancy symptoms in third trimester. Reviewed comfort measures.  - Reviewed kick counts and preterm labor warning signs. Instructed to call office or come to hospital with persistent headache, vision changes, regular contractions, leaking of fluid, decreased fetal movement or vaginal bleeding.    Orders Placed This Encounter  Procedures   Tdap vaccine greater than or equal to 7yo IM   Flu vaccine trivalent PF, 6mos and older(Flulaval,Afluria,Fluarix,Fluzone)   Return in about 2 weeks (around 11/16/2022) for ROB.   Future Appointments  Date Time Provider Department Center  11/16/2022  8:35 AM Julieanne Manson, MD [facility] None    For next visit:  continue with routine prenatal care     Subjective   27 y.o. G1P0 at [redacted]w[redacted]d presents for this follow-up prenatal visit.  Patient has been experiencing nausea and fatigue again, also some periods of dizziness. Patient reports: Movement: Present Contractions: Not present  Objective   Flow sheet Vitals: Pulse Rate: 85 BP: 125/85 Fundal Height: 31 cm Fetal Heart Rate (bpm): 145 Total weight gain: 21 lb (9.526 kg)  General Appearance  No acute distress, well appearing, and well nourished Pulmonary   Normal work of breathing Neurologic   Alert and oriented to person, place, and time Psychiatric   Mood and affect within normal limits  Lindalou Hose Harvy Riera, CNM  10/14/249:20 AM

## 2022-11-02 NOTE — Assessment & Plan Note (Signed)
-   Flu and tdap vaccinations today in clinic. - Discussed contraception options, planning to use condoms.  - Provided reassurance for return of pregnancy symptoms in third trimester. Reviewed comfort measures.  - Reviewed kick counts and preterm labor warning signs. Instructed to call office or come to hospital with persistent headache, vision changes, regular contractions, leaking of fluid, decreased fetal movement or vaginal bleeding.

## 2022-11-16 ENCOUNTER — Encounter: Payer: Self-pay | Admitting: Obstetrics

## 2022-11-16 ENCOUNTER — Ambulatory Visit (INDEPENDENT_AMBULATORY_CARE_PROVIDER_SITE_OTHER): Payer: Medicaid Other | Admitting: Obstetrics

## 2022-11-16 ENCOUNTER — Encounter: Payer: Medicaid Other | Admitting: Obstetrics

## 2022-11-16 VITALS — BP 120/82 | HR 80 | Wt 172.0 lb

## 2022-11-16 DIAGNOSIS — Z3403 Encounter for supervision of normal first pregnancy, third trimester: Secondary | ICD-10-CM

## 2022-11-16 DIAGNOSIS — E669 Obesity, unspecified: Secondary | ICD-10-CM

## 2022-11-16 DIAGNOSIS — O99210 Obesity complicating pregnancy, unspecified trimester: Secondary | ICD-10-CM

## 2022-11-16 DIAGNOSIS — O99213 Obesity complicating pregnancy, third trimester: Secondary | ICD-10-CM

## 2022-11-16 DIAGNOSIS — Z3A32 32 weeks gestation of pregnancy: Secondary | ICD-10-CM

## 2022-11-16 NOTE — Progress Notes (Signed)
    Return Prenatal Note   Subjective  27 y.o. G1P0 at [redacted]w[redacted]d presents with her husband for this follow-up prenatal visit. Pregnancy c/b BMI, VNI and RNI. Patient does want the RSV vaccine, but would like at her next visit. Reports has completed CBE classes and desires a water birth, classes also completed.   Patient reports: the dizziness that she was feeling has stopped.  Movement: Present Contractions: Irritability Denies vaginal bleeding or leaking fluid. Objective  Flow sheet Vitals: Pulse Rate: 80 BP: 120/82 Fundal Height: 32 cm Fetal Heart Rate (bpm): 141 Total weight gain: 27 lb (12.2 kg)  General Appearance  No acute distress, well appearing, and well nourished Pulmonary   Normal work of breathing Neurologic   Alert and oriented to person, place, and time Psychiatric   Mood and affect within normal limits  Assessment/Plan   Plan  27 y.o. G1P0 at [redacted]w[redacted]d by 6wk [place] presents for follow-up OB visit. Reviewed prenatal record including previous visit note.  1. Encounter for supervision of normal first pregnancy in third trimester -RSV vaccine discussed, pt would like at her 34wk visit -Discussed pain-control options, is planning water birth and open to NO   2. Obesity in pregnancy -BMI, total weight gain no 27lbs, encouraged to limit for optimal fetal/maternal outcomes.     Future Appointments  Date Time Provider Department Center  11/30/2022  8:15 AM Free, Lindalou Hose, CNM [facility] None    For next visit: 2wks,  RSV vaccine   Julieanne Manson, DO [facility]

## 2022-11-30 ENCOUNTER — Encounter: Payer: Self-pay | Admitting: Obstetrics and Gynecology

## 2022-11-30 ENCOUNTER — Observation Stay
Admission: RE | Admit: 2022-11-30 | Discharge: 2022-11-30 | Disposition: A | Discharge status: Home / Self Care | Payer: Medicaid Other | Source: Ambulatory Visit | Attending: Obstetrics | Admitting: Obstetrics

## 2022-11-30 ENCOUNTER — Other Ambulatory Visit: Payer: Self-pay

## 2022-11-30 ENCOUNTER — Ambulatory Visit (INDEPENDENT_AMBULATORY_CARE_PROVIDER_SITE_OTHER): Payer: Medicaid Other

## 2022-11-30 VITALS — BP 133/98 | HR 105 | Wt 174.1 lb

## 2022-11-30 DIAGNOSIS — O09899 Supervision of other high risk pregnancies, unspecified trimester: Secondary | ICD-10-CM

## 2022-11-30 DIAGNOSIS — Z3A34 34 weeks gestation of pregnancy: Secondary | ICD-10-CM | POA: Insufficient documentation

## 2022-11-30 DIAGNOSIS — Z7982 Long term (current) use of aspirin: Secondary | ICD-10-CM | POA: Diagnosis not present

## 2022-11-30 DIAGNOSIS — O99210 Obesity complicating pregnancy, unspecified trimester: Secondary | ICD-10-CM

## 2022-11-30 DIAGNOSIS — Z23 Encounter for immunization: Secondary | ICD-10-CM | POA: Diagnosis not present

## 2022-11-30 DIAGNOSIS — O163 Unspecified maternal hypertension, third trimester: Secondary | ICD-10-CM | POA: Diagnosis not present

## 2022-11-30 DIAGNOSIS — Z2911 Encounter for prophylactic immunotherapy for respiratory syncytial virus (RSV): Secondary | ICD-10-CM

## 2022-11-30 DIAGNOSIS — Z2839 Other underimmunization status: Secondary | ICD-10-CM

## 2022-11-30 DIAGNOSIS — Z3403 Encounter for supervision of normal first pregnancy, third trimester: Principal | ICD-10-CM

## 2022-11-30 DIAGNOSIS — O99891 Other specified diseases and conditions complicating pregnancy: Secondary | ICD-10-CM

## 2022-11-30 DIAGNOSIS — R030 Elevated blood-pressure reading, without diagnosis of hypertension: Secondary | ICD-10-CM

## 2022-11-30 DIAGNOSIS — O139 Gestational [pregnancy-induced] hypertension without significant proteinuria, unspecified trimester: Secondary | ICD-10-CM | POA: Insufficient documentation

## 2022-11-30 DIAGNOSIS — O1493 Unspecified pre-eclampsia, third trimester: Secondary | ICD-10-CM | POA: Diagnosis not present

## 2022-11-30 DIAGNOSIS — O133 Gestational [pregnancy-induced] hypertension without significant proteinuria, third trimester: Secondary | ICD-10-CM | POA: Diagnosis present

## 2022-11-30 DIAGNOSIS — Z3490 Encounter for supervision of normal pregnancy, unspecified, unspecified trimester: Secondary | ICD-10-CM

## 2022-11-30 LAB — COMPREHENSIVE METABOLIC PANEL
ALT: 14 U/L (ref 0–44)
AST: 18 U/L (ref 15–41)
Albumin: 2.9 g/dL — ABNORMAL LOW (ref 3.5–5.0)
Alkaline Phosphatase: 160 U/L — ABNORMAL HIGH (ref 38–126)
Anion gap: 8 (ref 5–15)
BUN: 5 mg/dL — ABNORMAL LOW (ref 6–20)
CO2: 19 mmol/L — ABNORMAL LOW (ref 22–32)
Calcium: 8.6 mg/dL — ABNORMAL LOW (ref 8.9–10.3)
Chloride: 108 mmol/L (ref 98–111)
Creatinine, Ser: 0.42 mg/dL — ABNORMAL LOW (ref 0.44–1.00)
GFR, Estimated: >60 mL/min (ref >60)
Glucose, Bld: 95 mg/dL (ref 70–99)
Potassium: 3.5 mmol/L (ref 3.5–5.1)
Sodium: 135 mmol/L (ref 135–145)
Total Bilirubin: 0.4 mg/dL (ref <1.2)
Total Protein: 6 g/dL — ABNORMAL LOW (ref 6.5–8.1)

## 2022-11-30 LAB — PROTEIN / CREATININE RATIO, URINE
Creatinine, Urine: 170 mg/dL
Protein Creatinine Ratio: 0.14 mg/mg{creat} (ref 0.00–0.15)
Total Protein, Urine: 23 mg/dL

## 2022-11-30 LAB — CBC
HCT: 36.7 % (ref 36.0–46.0)
Hemoglobin: 12.7 g/dL (ref 12.0–15.0)
MCH: 28.3 pg (ref 26.0–34.0)
MCHC: 34.6 g/dL (ref 30.0–36.0)
MCV: 81.7 fL (ref 80.0–100.0)
Platelets: 257 10*3/uL (ref 150–400)
RBC: 4.49 MIL/uL (ref 3.87–5.11)
RDW: 12.3 % (ref 11.5–15.5)
WBC: 8.2 10*3/uL (ref 4.0–10.5)
nRBC: 0 % (ref 0.0–0.2)

## 2022-11-30 NOTE — Assessment & Plan Note (Addendum)
-   Prepared for GBS and GC/CT screening at next appointment.  - RSV vaccination given today in clinic.  - Recommended calling L&D front desk to ask about tours since they have not been able to schedule through the online portal.  - Reviewed kick counts and preterm labor warning signs. Instructed to call office or come to hospital with persistent headache, vision changes, regular contractions, leaking of fluid, decreased fetal movement or vaginal bleeding.

## 2022-11-30 NOTE — Assessment & Plan Note (Addendum)
-   Initial blood pressure of 136/95, on repeat 133/98. Reviewed diagnosis of PIH in pregnancy and needing two MRBPs separated by more than 4 hours for diagnosis of gestational hypertension. Also discussed the diagnosis of preeclampsia. Recommended PIH work up at L&D triage which patient is in agreement with. Notified L&D and CNM on call.

## 2022-11-30 NOTE — Final Progress Note (Signed)
OB/Triage Note  Patient ID: Kim Maxwell MRN: [id number] DOB/AGE: 1995/06/14 27 y.o.  Subjective  History of Present Illness: The patient is a 27 y.o. female G1P0 at [redacted]w[redacted]d who presents for evaluation for preeclampsia. She was sent over from clinic following an elevated blood pressure reading during a ROB.  In clinic initial BP reading was 136/95, repeat was 133/98. Patient denies HA, RUQ pain, visual changes, painful contractions, VB or LOB. Endorses good FM.  Past Medical History:  Diagnosis Date   Polycystic ovarian syndrome     Past Surgical History:  Procedure Laterality Date   ESOPHAGOGASTRODUODENOSCOPY (EGD) WITH PROPOFOL N/A 03/18/2020   Procedure: ESOPHAGOGASTRODUODENOSCOPY (EGD) WITH PROPOFOL;  Surgeon: [name], MD;  Location: [facility] END[facility]COPY;  Service: Gastroenterology;  Laterality: N/A;    No current facility-administered medications on file prior to encounter.   Current Outpatient Medications on File Prior to Encounter  Medication Sig Dispense Refill   aspirin 81 MG chewable tablet Chew 1 tablet (81 mg total) by mouth daily.      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 [place]  Tobacco Use   Smoking status: Never   Smokeless tobacco: Never  Vaping Use   Vaping status: Never Used  Substance and Sexual Activity   Alcohol use: Not Currently    Comment: ocassional maybe one per month   Drug use: Never   Sexual activity: Yes    Partners: Male    Birth control/protection: Condom  Other Topics Concern   Not on file  Social History Narrative   Not on file   Social Determinants of Health   Financial Resource Strain: Medium Risk (05/14/2022)   Overall Financial Resource Strain (CARDIA)    Difficulty of Paying Living Expenses:  Somewhat hard  Food Insecurity: No Food Insecurity (05/14/2022)   Hunger Vital Sign    Worried About Running Out of Food in the Last Year: Never true    Ran Out of Food in the Last Year: Never true  Transportation Needs: No Transportation Needs (05/14/2022)   PRAPARE - Administrator, Civil Service (Medical): No    Lack of Transportation (Non-Medical): No  Physical Activity: Insufficiently Active (05/14/2022)   Exercise Vital Sign    Days of Exercise per Week: 3 days    Minutes of Exercise per Session: 20 min  Stress: No Stress Concern Present (05/14/2022)   [employer] of Occupational Health - Occupational Stress Questionnaire    Feeling of Stress : Not at all  Social Connections: Moderately Isolated (05/14/2022)   Social Connection and Isolation Panel [NHANES]    Frequency of Communication with Friends and Family: Twice a week    Frequency of Social Gatherings with Friends and Family: Twice a week    Attends Religious Services: Never    Database administrator or Organizations: No    Attends Banker Meetings: Never    Marital Status: Living with partner  Intimate Partner Violence: Not At Risk (05/14/2022)   Humiliation, Afraid, Rape, and Kick questionnaire    Fear of Current or Ex-Partner: No    Emotionally Abused: No    Physically Abused: No    Sexually Abused: No    Family History  Problem Relation  Age of Onset   Healthy Mother    Healthy Father    Healthy Brother    Hypertension Maternal Grandmother    Healthy Maternal Grandfather    Healthy Paternal Grandmother    Healthy Paternal Grandfather      ROS    Objective  Physical Exam: BP 128/89   Pulse 78   Temp 98.5 F (36.9 C) (Oral)   Resp 18   Ht 4\' 10"  (1.473 m)   Wt 78.9 kg   LMP 02/11/2022 (Exact Date) Comment: Had a little spotting Febuary, reported history of Polycystic Ovarian sydrome  BMI 36.37 kg/m   OBGyn Exam  FHT 135, mod variability, pos accels, no decels Toco:  infrequent contractions, not painful for patient, likely braxton hicks  Significant Findings/ Diagnostic Studies: PIH labs neg   Hospital Course: The patient was admitted to [facility] Triage for observation. Had reactive NST, denied any s/sx of preeclampsia. Initial blood pressure reading was elevated at 150/99 although patient stated she was anxious. All six subsequent blood pressures were WNL. CBC, PC ratio and CMP were all WNL. Discussed with patient that she currently does not have preeclampsias or gestational hypertension although if she has another elevated blood pressure reading more than four hours from her first elevated reading that she would meet criteria for GHTN. Also spoke about risk of GHTN leading to preeclampsia and typical monitoring/management of hypertensive disease of pregnancy. Patient's next scheduled ROB is in two weeks, did tell her to make an appointment in one week for a blood pressure check, which she is on board with. Also discussed warning s/sx and when to return.  Assessment: 27 y.o. female G1P0 at [redacted]w[redacted]d  RNST Isolated elevated blood pressure reading Preeclampsia labs negative, currently does not meet criteria for GHTN dx  Plan: Discharge home Call to schedule a follow up blood pressure check in one week Teaching: return to OB/triage for headache not relieved by tylenol, visual changes, RUQ pain, decreased fetal movement, vaginal bleeding, leaking of fluid or preterm labor.  Discharge Instructions     Discharge activity:  No Restrictions   Complete by: As directed    Discharge diet:  No restrictions   Complete by: As directed    Discharge instructions   Complete by: As directed    Call to schedule a visit in one week for a blood pressure check. Return to OB/triage for worrisome s/sx including headache not relieved with tylenol, visual changes or right upper quadrant pain.   LABOR:  When conractions begin, you should start to time them from the beginning of one  contraction to the beginning  of the next.  When contractions are 5 - 10 minutes apart or less and have been regular for at least an hour, you should call your health care provider.   Complete by: As directed    No sexual activity restrictions   Complete by: As directed    Notify physician for a general feeling that "something is not right"   Complete by: As directed    Notify physician for bleeding from the vagina   Complete by: As directed    Notify physician for blurring of vision or spots before the eyes   Complete by: As directed    Notify physician for chills or fever   Complete by: As directed    Notify physician for fainting spells, "black outs" or loss of consciousness   Complete by: As directed    Notify physician for increase in vaginal discharge  Complete by: As directed    Notify physician for increase or change in vaginal discharge   Complete by: As directed    Notify physician for intestinal cramps, with or without diarrhea, sometimes described as "gas pain"   Complete by: As directed    Notify physician for leaking of fluid   Complete by: As directed    Notify physician for leaking of fluid   Complete by: As directed    Notify physician for low, dull backache, unrelieved by heat or Tylenol   Complete by: As directed    Notify physician for menstrual like cramps   Complete by: As directed    Notify physician for pain or burning when urinating   Complete by: As directed    Notify physician for pelvic pressure   Complete by: As directed    Notify physician for pelvic pressure (sudden increase)   Complete by: As directed    Notify physician for severe or continued nausea or vomiting   Complete by: As directed    Notify physician for sudden gushing of fluid from the vagina (with or without continued leaking)   Complete by: As directed    Notify physician for sudden, constant, or occasional abdominal pain   Complete by: As directed    Notify physician for uterine  contractions.  These may be painless and feel like the uterus is tightening or the baby is  "balling up"   Complete by: As directed    Notify physician for vaginal bleeding   Complete by: As directed    Notify physician if baby moving less than usual   Complete by: As directed    PRETERM LABOR:  Includes any of the follwing symptoms that occur between 20 - [redacted] weeks gestation.  If these symptoms are not stopped, preterm labor can result in preterm delivery, placing your baby at risk   Complete by: As directed       Allergies as of 11/30/2022   No Known Allergies      Medication List     TAKE these medications    aspirin 81 MG chewable tablet Chew 1 tablet (81 mg total) by mouth daily.         Total time spent taking care of this patient: 2 hours  Signed: Raeford Razor CNM, FNP 11/30/2022, 11:22 AM

## 2022-11-30 NOTE — OB Triage Note (Signed)
Discharge instructions, labor precautions, and follow-up care reviewed with patient and significant other. All questions answered. Patient verbalized understanding. Discharged ambulatory off unit.  

## 2022-11-30 NOTE — Progress Notes (Signed)
    Return Prenatal Note   Assessment/Plan   Plan  27 y.o. G1P0 at [redacted]w[redacted]d presents for follow-up OB visit. Reviewed prenatal record including previous visit note.  Encounter for supervision of normal first pregnancy in third trimester - Prepared for GBS and GC/CT screening at next appointment.  - RSV vaccination given today in clinic.  - Recommended calling L&D front desk to ask about tours since they have not been able to schedule through the online portal.  - Reviewed kick counts and preterm labor warning signs. Instructed to call office or come to hospital with persistent headache, vision changes, regular contractions, leaking of fluid, decreased fetal movement or vaginal bleeding.  Elevated blood pressure reading without diagnosis of hypertension - Initial blood pressure of 136/95, on repeat 133/98. Reviewed diagnosis of PIH in pregnancy and needing two MRBPs separated by more than 4 hours for diagnosis of gestational hypertension. Also discussed the diagnosis of preeclampsia. Recommended PIH work up at L&D triage which patient is in agreement with. Notified L&D and CNM on call.    Orders Placed This Encounter  Procedures   Respiratory syncytial virus vaccine, preF, subunit, bivalent,(Abrysvo)   Return in about 2 weeks (around 12/14/2022) for ROB.   No future appointments.  For next visit:  ROB with GBS screening      Subjective   27 y.o. G1P0 at [redacted]w[redacted]d presents for this follow-up prenatal visit.  Patient has questions about hospital tour.  Patient reports: Movement: Present Contractions: Irritability  Objective   Flow sheet Vitals: Pulse Rate: (!) 105 BP: (!) 133/98 Fundal Height: 34 cm Fetal Heart Rate (bpm): 130 Presentation: Vertex Total weight gain: 29 lb 1.6 oz (13.2 kg)  General Appearance  No acute distress, well appearing, and well nourished Pulmonary   Normal work of breathing Neurologic   Alert and oriented to person, place, and  time Psychiatric   Mood and affect within normal limits  Lindalou Hose Henreitta Spittler, CNM  11/11/248:39 AM

## 2022-11-30 NOTE — OB Triage Note (Signed)
Patient is a G1P0 at [redacted]w[redacted]d who was sent over from the office for a PIH eval. Reports +FM and occasional Braxton Hicks contractions. Denies vaginal bleeding and leakage of fluid. Denies headache, epigastric pain, and excessive swelling. External monitors applied and assessing. Initial FHT 145. Initial BP 150/99, cycling q .

## 2022-12-14 ENCOUNTER — Encounter: Payer: Self-pay | Admitting: Obstetrics and Gynecology

## 2022-12-14 ENCOUNTER — Other Ambulatory Visit: Payer: Self-pay

## 2022-12-14 ENCOUNTER — Observation Stay: Payer: Medicaid Other

## 2022-12-14 ENCOUNTER — Observation Stay
Admission: RE | Admit: 2022-12-14 | Discharge: 2022-12-14 | Disposition: A | Discharge status: Home / Self Care | Payer: Medicaid Other | Attending: Advanced Practice Midwife | Admitting: Advanced Practice Midwife

## 2022-12-14 ENCOUNTER — Other Ambulatory Visit (HOSPITAL_COMMUNITY)
Admission: RE | Admit: 2022-12-14 | Discharge: 2022-12-14 | Disposition: A | Discharge status: Home / Self Care | Payer: Medicaid Other | Source: Ambulatory Visit

## 2022-12-14 ENCOUNTER — Ambulatory Visit: Payer: Medicaid Other

## 2022-12-14 ENCOUNTER — Ambulatory Visit (INDEPENDENT_AMBULATORY_CARE_PROVIDER_SITE_OTHER): Payer: Medicaid Other

## 2022-12-14 VITALS — BP 143/99 | HR 92 | Ht <= 58 in | Wt 180.2 lb

## 2022-12-14 VITALS — BP 144/112 | HR 101 | Wt 180.2 lb

## 2022-12-14 DIAGNOSIS — O163 Unspecified maternal hypertension, third trimester: Secondary | ICD-10-CM | POA: Diagnosis present

## 2022-12-14 DIAGNOSIS — E876 Hypokalemia: Secondary | ICD-10-CM | POA: Diagnosis not present

## 2022-12-14 DIAGNOSIS — O99283 Endocrine, nutritional and metabolic diseases complicating pregnancy, third trimester: Secondary | ICD-10-CM | POA: Insufficient documentation

## 2022-12-14 DIAGNOSIS — R030 Elevated blood-pressure reading, without diagnosis of hypertension: Secondary | ICD-10-CM

## 2022-12-14 DIAGNOSIS — O133 Gestational [pregnancy-induced] hypertension without significant proteinuria, third trimester: Secondary | ICD-10-CM

## 2022-12-14 DIAGNOSIS — Z3A36 36 weeks gestation of pregnancy: Secondary | ICD-10-CM

## 2022-12-14 DIAGNOSIS — Z3403 Encounter for supervision of normal first pregnancy, third trimester: Secondary | ICD-10-CM | POA: Insufficient documentation

## 2022-12-14 DIAGNOSIS — Z113 Encounter for screening for infections with a predominantly sexual mode of transmission: Secondary | ICD-10-CM

## 2022-12-14 DIAGNOSIS — O99210 Obesity complicating pregnancy, unspecified trimester: Secondary | ICD-10-CM

## 2022-12-14 DIAGNOSIS — Z3685 Encounter for antenatal screening for Streptococcus B: Secondary | ICD-10-CM

## 2022-12-14 DIAGNOSIS — O09899 Supervision of other high risk pregnancies, unspecified trimester: Secondary | ICD-10-CM

## 2022-12-14 LAB — CBC
HCT: 32.7 % — ABNORMAL LOW (ref 36.0–46.0)
Hemoglobin: 11.5 g/dL — ABNORMAL LOW (ref 12.0–15.0)
MCH: 28.2 pg (ref 26.0–34.0)
MCHC: 35.2 g/dL (ref 30.0–36.0)
MCV: 80.1 fL (ref 80.0–100.0)
Platelets: 246 10*3/uL (ref 150–400)
RBC: 4.08 MIL/uL (ref 3.87–5.11)
RDW: 12.7 % (ref 11.5–15.5)
WBC: 8.5 10*3/uL (ref 4.0–10.5)
nRBC: 0 % (ref 0.0–0.2)

## 2022-12-14 LAB — COMPREHENSIVE METABOLIC PANEL
ALT: 15 U/L (ref 0–44)
AST: 19 U/L (ref 15–41)
Albumin: 2.8 g/dL — ABNORMAL LOW (ref 3.5–5.0)
Alkaline Phosphatase: 188 U/L — ABNORMAL HIGH (ref 38–126)
Anion gap: 8 (ref 5–15)
BUN: 8 mg/dL (ref 6–20)
CO2: 20 mmol/L — ABNORMAL LOW (ref 22–32)
Calcium: 8.2 mg/dL — ABNORMAL LOW (ref 8.9–10.3)
Chloride: 109 mmol/L (ref 98–111)
Creatinine, Ser: 0.46 mg/dL (ref 0.44–1.00)
GFR, Estimated: >60 mL/min (ref >60)
Glucose, Bld: 133 mg/dL — ABNORMAL HIGH (ref 70–99)
Potassium: 3.2 mmol/L — ABNORMAL LOW (ref 3.5–5.1)
Sodium: 137 mmol/L (ref 135–145)
Total Bilirubin: 0.6 mg/dL (ref <1.2)
Total Protein: 5.9 g/dL — ABNORMAL LOW (ref 6.5–8.1)

## 2022-12-14 LAB — PROTEIN / CREATININE RATIO, URINE
Creatinine, Urine: 164 mg/dL
Protein Creatinine Ratio: 0.14 mg/mg{creat} (ref 0.00–0.15)
Total Protein, Urine: 23 mg/dL

## 2022-12-14 MED ORDER — POTASSIUM CHLORIDE CRYS ER 10 MEQ PO TBCR: 10.0000 meq | EXTENDED_RELEASE_TABLET | Freq: Every day | ORAL | 0 refills | Status: DC

## 2022-12-14 NOTE — Progress Notes (Signed)

## 2022-12-14 NOTE — Progress Notes (Signed)
Pt presents to L/D triage with reported elevated blood pressures in office. Pt reports no noted PIH symptoms- +2 reflexes, no clonus noted. Pt reports no pain, bleeding, or LOF and positive fetal movement.  Monitors applied and assessing- initial FHT 150. Initial BP 142/92-cycling q15 min.  CNM notified of patient's arrival and labs drawn.

## 2022-12-14 NOTE — Discharge Summary (Cosign Needed Addendum)
Physician Final Progress Note  Patient ID: Kim Maxwell MRN: [id number] DOB/AGE: [birth year] 25 y.o.  Admit date: 12/14/2022 Admitting provider: Tresea Mall, CNM Discharge date: 12/14/2022   Admission Diagnoses:  1) intrauterine pregnancy at [redacted]w[redacted]d  2) elevated blood pressure in clinic 3) gestational hypertension 4) hypokalemia  Discharge Diagnoses:  Active Problems:   Elevated blood pressure affecting pregnancy in third trimester, antepartum   [redacted] weeks gestation of pregnancy  Gestational Hypertension  History of Present Illness: The patient is a 27 y.o. female G1P0 at [redacted]w[redacted]d who presents from the clinic for [facility] evaluation. She reports good fetal movement. She denies contractions, leakage of fluid or vaginal bleeding. She denies headache, visual changes or epigastric pain. She is admitted for observation and placed on monitors. Labs collected. BPP and growth ultrasounds done. Labs are within normal limits and preliminary ultrasound reports are reassuring. She is discharged to home with instructions and precautions.    Past Medical History:  Diagnosis Date   Polycystic ovarian syndrome     Past Surgical History:  Procedure Laterality Date   ESOPHAGOGASTRODUODENOSCOPY (EGD) WITH PROPOFOL N/A 03/18/2020   Procedure: ESOPHAGOGASTRODUODENOSCOPY (EGD) WITH PROPOFOL;  Surgeon: [name], MD;  Location: [facility] ENDOSCOPY;  Service: Gastroenterology;  Laterality: N/A;    No current facility-administered medications on file prior to encounter.   Current Outpatient Medications on File Prior to Encounter  Medication Sig Dispense Refill   aspirin 81 MG chewable tablet Chew 1 tablet (81 mg total) by mouth daily.      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 [place]   Tobacco Use   Smoking status: Never   Smokeless tobacco: Never  Vaping Use   Vaping status: Never Used  Substance and Sexual Activity   Alcohol use: Not Currently    Comment: ocassional maybe one per month   Drug use: Never   Sexual activity: Yes    Partners: Male    Birth control/protection: Condom  Other Topics Concern   Not on file  Social History Narrative   Not on file   Social Determinants of Health   Financial Resource Strain: Medium Risk (05/14/2022)   Overall Financial Resource Strain (CARDIA)    Difficulty of Paying Living Expenses: Somewhat hard  Food Insecurity: No Food Insecurity (05/14/2022)   Hunger Vital Sign    Worried About Running Out of Food in the Last Year: Never true    Ran Out of Food in the Last Year: Never true  Transportation Needs: No Transportation Needs (05/14/2022)   PRAPARE - Administrator, Civil Service (Medical): No    Lack of Transportation (Non-Medical): No  Physical Activity: Insufficiently Active (05/14/2022)   Exercise Vital Sign    Days of Exercise per Week: 3 days    Minutes of Exercise per Session: 20 min  Stress: No Stress Concern Present (05/14/2022)   [employer] of Occupational Health - Occupational Stress Questionnaire    Feeling of Stress : Not at all  Social Connections: Moderately Isolated (05/14/2022)   Social Connection and Isolation Panel [NHANES]    Frequency of Communication with Friends and Family: Twice a week    Frequency of Social Gatherings with Friends and Family: Twice a week    Attends  Religious Services: Never    Active Member of Clubs or Organizations: No    Attends Banker Meetings: Never    Marital Status: Living with partner  Intimate Partner Violence: Not At Risk (05/14/2022)   Humiliation, Afraid, Rape, and Kick questionnaire    Fear of Current or Ex-Partner: No    Emotionally Abused: No    Physically Abused: No    Sexually Abused: No    Family History  Problem  Relation Age of Onset   Healthy Mother    Healthy Father    Healthy Brother    Hypertension Maternal Grandmother    Healthy Maternal Grandfather    Healthy Paternal Grandmother    Healthy Paternal Grandfather      Review of Systems  Constitutional:  Negative for chills and fever.  HENT:  Negative for congestion, ear discharge, ear pain, hearing loss, sinus pain and sore throat.   Eyes:  Negative for blurred vision and double vision.  Respiratory:  Negative for cough, shortness of breath and wheezing.   Cardiovascular:  Negative for chest pain, palpitations and leg swelling.  Gastrointestinal:  Negative for abdominal pain, blood in stool, constipation, diarrhea, heartburn, melena, nausea and vomiting.  Genitourinary:  Negative for dysuria, flank pain, frequency, hematuria and urgency.  Musculoskeletal:  Negative for back pain, joint pain and myalgias.  Skin:  Negative for itching and rash.  Neurological:  Negative for dizziness, tingling, tremors, sensory change, speech change, focal weakness, seizures, loss of consciousness, weakness and headaches.  Endo/Heme/Allergies:  Negative for environmental allergies. Does not bruise/bleed easily.  Psychiatric/Behavioral:  Negative for depression, hallucinations, memory loss, substance abuse and suicidal ideas. The patient is not nervous/anxious and does not have insomnia.      Physical Exam: BP 122/79   Pulse 88   Temp 98.3 F (36.8 C) (Oral)   Resp 18   Ht 4\' 10"  (1.473 m)   Wt 81.6 kg   LMP 02/11/2022 (Exact Date) Comment: Had a little spotting Febuary, reported history of Polycystic Ovarian sydrome  BMI 37.62 kg/m   Constitutional: Well nourished, well developed female in no acute distress.  HEENT: normal Skin: Warm and dry.  Cardiovascular: Regular rate and rhythm.   Extremity:  no edema   Respiratory: Clear to auscultation bilateral. Normal respiratory effort Abdomen: FHT present Psych: Alert and Oriented x3. No memory  deficits. Normal mood and affect.   Patient Vitals for the past 24 hrs:  BP Temp Temp src Pulse Resp Height Weight  12/14/22 1329 122/79 -- -- 88 -- -- --  12/14/22 1314 (!) 133/97 -- -- (!) 103 -- -- --  12/14/22 1259 (!) 139/95 -- -- 87 -- -- --  12/14/22 1124 (!) 135/97 -- -- (!) 109 -- -- --  12/14/22 1109 130/89 -- -- 90 -- -- --  12/14/22 1054 (!) 130/95 -- -- 100 -- -- --  12/14/22 1041 (!) 142/92 98.3 F (36.8 C) Oral 94 18 4\' 10"  (1.473 m) 81.6 kg     Toco: negative Fetal Well Being: 140 bpm, moderate variability, +accelerations, -decelerations, Reactive NST  Consults: None  Significant Findings/ Diagnostic Studies: labs, imaging  Latest Reference Range & Units 12/14/22 10:45 12/14/22 11:13 12/14/22 12:55  COMPREHENSIVE METABOLIC PANEL   Rpt !   Sodium 135 - 145 mmol/L  137   Potassium 3.5 - 5.1 mmol/L  3.2 (L)   Chloride 98 - 111 mmol/L  109   CO2 22 - 32 mmol/L  20 (L)   Glucose  70 - 99 mg/dL  469 (H)   BUN 6 - 20 mg/dL  8   Creatinine 6.29 - 1.00 mg/dL  5.28   Calcium 8.9 - 41.3 mg/dL  8.2 (L)   Anion gap 5 - 15   8   Alkaline Phosphatase 38 - 126 U/L  188 (H)   Albumin 3.5 - 5.0 g/dL  2.8 (L)   AST 15 - 41 U/L  19   ALT 0 - 44 U/L  15   Total Protein 6.5 - 8.1 g/dL  5.9 (L)   Total Bilirubin <1.2 mg/dL  0.6   GFR, Estimated >24 mL/min  >60   WBC 4.0 - 10.5 K/uL  8.5   RBC 3.87 - 5.11 MIL/uL  4.08   Hemoglobin 12.0 - 15.0 g/dL  40.1 (L)   HCT 02.7 - 46.0 %  32.7 (L)   MCV 80.0 - 100.0 fL  80.1   MCH 26.0 - 34.0 pg  28.2   MCHC 30.0 - 36.0 g/dL  25.3   RDW 66.4 - 40.3 %  12.7   Platelets 150 - 400 K/uL  246   nRBC 0.0 - 0.2 %  0.0   Total Protein, Urine mg/dL 23    Protein Creatinine Ratio 0.00 - 0.15 mg/mgCre 0.14    Creatinine, Urine mg/dL 474    US FETAL BPP WO NON STRESS    Rpt (IP)  US OB COMP + 14 WK    Rpt (IP)  !: Data is abnormal (L): Data is abnormally low (H): Data is abnormally high (IP): In Process Rpt: View report in Results Review for  more information   BPP preliminary report: 8/8  Procedures: NST  Hospital Course: The patient was admitted to Labor and Delivery Triage for observation.   Discharge Condition: good  Disposition: Discharge disposition: 01-Home or Self Care  Diet: Regular diet  Discharge Activity: Activity as tolerated  Discharge Instructions     Discharge activity:  No Restrictions   Complete by: As directed    Activity as tolerated   Discharge diet:  No restrictions   Complete by: As directed    Stay well hydrated, take baby aspirin daily      Allergies as of 12/14/2022   No Known Allergies      Medication List     TAKE these medications    aspirin 81 MG chewable tablet Chew 1 tablet (81 mg total) by mouth daily.   potassium chloride 10 MEQ tablet Commonly known as: KLOR-CON M Take 1 tablet (10 mEq total) by mouth daily for 14 days.        Follow-up Information     [facility]. Go to.   Specialty: Obstetrics and Gynecology Why: scheduled prenatal appointment Contact information: [address] [place] [address] [phone]                Total time spent taking care of this patient: 20 minutes  Signed: Tresea Mall, CNM  12/14/2022, 2:00 PM

## 2022-12-14 NOTE — Progress Notes (Signed)
    NURSE VISIT NOTE  Subjective:    Patient ID: Kim Maxwell, female    DOB: 1995-07-31, 27 y.o.   MRN: [id number]  HPI  Patient is a 27 y.o. G1P0 female who presents for fetal monitoring per order from [name], [place].   Objective:    BP (!) 143/99   Pulse 92   Ht 4\' 10"  (1.473 m)   Wt 180 lb 3.2 oz (81.7 kg)   LMP 02/11/2022 (Exact Date) Comment: Had a little spotting Febuary, reported history of Polycystic Ovarian sydrome  BMI 37.66 kg/m  Estimated Date of Delivery: 01/08/23  [redacted]w[redacted]d  Fetus A Non-Stress Test Interpretation for 12/14/22  Indication: Gestational Hypertension  Fetal Heart Rate A Mode: External Baseline Rate (A): 140 bpm Variability: Moderate Accelerations: 15 x 15 Decelerations: None Multiple birth?: No  Uterine Activity Mode: Toco Contraction Frequency (min): None  Interpretation (Fetal Testing) Nonstress Test Interpretation: Reactive Overall Impression: Reassuring for gestational age   Assessment:   1. Gestational hypertension, third trimester   2. [redacted] weeks gestation of pregnancy      Plan:   Results reviewed and discussed with patient by  [name], CNM.     Rocco Serene, LPN

## 2022-12-14 NOTE — Assessment & Plan Note (Addendum)
-   Reviewed diagnosis of gestational hypertension and plan for antenatal surveillance and delivery.  - Initial BP mild range, diastolic severe range on recheck (148/112). RNST in clinic. Recommended patient go to L&D triage for serial blood pressures and fetal monitoring. On-call CNM notified.  - Preeclampsia precautions reviewed.

## 2022-12-14 NOTE — Progress Notes (Signed)
    Return Prenatal Note   Assessment/Plan   Plan  27 y.o. G1P0 at [redacted]w[redacted]d presents for follow-up OB visit. Reviewed prenatal record including previous visit note.  Encounter for supervision of normal first pregnancy in third trimester - GBS and GC/CT screening swabs self-collected today in clinic.  - Area of edema noted on lower abdomen consistent with area where patient has been feeling tenderness. Dr. Lonny Prude examine patient as well and feels edema may be related to elevated blood pressures.  - Provided with information on encouraging timely labor.  - Reviewed labor warning signs and expectations for birth. Instructed to call office or come to hospital with persistent headache, vision changes, regular contractions, leaking of fluid, decreased fetal movement or vaginal bleeding.  Gestational hypertension - Reviewed diagnosis of gestational hypertension and plan for antenatal surveillance and delivery.  - Initial BP mild range, diastolic severe range on recheck (148/112). RNST in clinic. Recommended patient go to L&D triage for serial blood pressures and fetal monitoring. On-call CNM notified.  - Preeclampsia precautions reviewed.    Orders Placed This Encounter  Procedures   Strep Gp B NAA   US OB Follow Up    Standing Status:   Standing    Number of Occurrences:   3    Standing Expiration Date:   02/13/2023    Order Specific Question:   Reason for exam:    Answer:   gHTN    Order Specific Question:   Preferred imaging location?    Answer:   Internal   US FETAL BPP WO NON STRESS    Standing Status:   Future    Standing Expiration Date:   12/14/2023    Order Specific Question:   Reason for Exam (SYMPTOM  OR DIAGNOSIS REQUIRED)    Answer:   gHTN    Order Specific Question:   Preferred Imaging Location?    Answer:   Internal   Protein / creatinine ratio, urine   Return in about 1 week (around 12/21/2022) for ROB.   Future Appointments  Date Time Provider Department Center   12/21/2022  3:15 PM Tresea Mall, CNM [facility] None  12/23/2022  2:30 PM [facility] [place] [facility] None    For next visit:  continue with routine prenatal care     Subjective   27 y.o. G1P0 at [redacted]w[redacted]d presents for this follow-up prenatal visit.  Patient has area of swelling and tenderness on lower abdomen that has become more prominent in last week. Patient reports: Movement: Present  Objective   Flow sheet Vitals: Pulse Rate: (!) 101 BP: (!) 144/112 Fundal Height: 36 cm Fetal Heart Rate (bpm): RNST Presentation: Vertex Total weight gain: 35 lb 3.2 oz (16 kg)  General Appearance  No acute distress, well appearing, and well nourished Pulmonary   Normal work of breathing Neurologic   Alert and oriented to person, place, and time Psychiatric   Mood and affect within normal limits  Lindalou Hose Jeanice Dempsey, CNM  12/13/2408:12 AM

## 2022-12-14 NOTE — Discharge Instructions (Signed)
Call your provider for or return to the hospital for any other concerns. Keep your scheduled follow up appointment for next week.

## 2022-12-14 NOTE — Patient Instructions (Signed)

## 2022-12-14 NOTE — Assessment & Plan Note (Addendum)
-   GBS and GC/CT screening swabs self-collected today in clinic.  - Area of edema noted on lower abdomen consistent with area where patient has been feeling tenderness. Dr. Lonny Prude examine patient as well and feels edema may be related to elevated blood pressures.  - Provided with information on encouraging timely labor.  - Reviewed labor warning signs and expectations for birth. Instructed to call office or come to hospital with persistent headache, vision changes, regular contractions, leaking of fluid, decreased fetal movement or vaginal bleeding.

## 2022-12-15 LAB — CERVICOVAGINAL ANCILLARY ONLY
Chlamydia: NEGATIVE
Comment: NEGATIVE
Comment: NORMAL
Neisseria Gonorrhea: NEGATIVE

## 2022-12-15 LAB — PROTEIN / CREATININE RATIO, URINE
Creatinine, Urine: 122.5 mg/dL
Protein, Ur: 22.5 mg/dL
Protein/Creat Ratio: 184 mg/g{creat} (ref 0–200)

## 2022-12-16 LAB — STREP GP B NAA: Strep Gp B NAA: NEGATIVE

## 2022-12-21 ENCOUNTER — Encounter: Payer: Self-pay | Admitting: Advanced Practice Midwife

## 2022-12-21 ENCOUNTER — Other Ambulatory Visit: Payer: Self-pay

## 2022-12-21 ENCOUNTER — Inpatient Hospital Stay
Admission: AD | Admit: 2022-12-21 | Discharge: 2022-12-25 | DRG: 787 | Disposition: A | Discharge status: Home / Self Care | Payer: Medicaid Other | Source: Ambulatory Visit | Attending: Certified Nurse Midwife | Admitting: Certified Nurse Midwife

## 2022-12-21 ENCOUNTER — Encounter: Payer: Self-pay | Admitting: Obstetrics and Gynecology

## 2022-12-21 ENCOUNTER — Ambulatory Visit (INDEPENDENT_AMBULATORY_CARE_PROVIDER_SITE_OTHER): Payer: Medicaid Other | Admitting: Advanced Practice Midwife

## 2022-12-21 VITALS — BP 146/109 | HR 93 | Wt 181.9 lb

## 2022-12-21 DIAGNOSIS — O134 Gestational [pregnancy-induced] hypertension without significant proteinuria, complicating childbirth: Secondary | ICD-10-CM | POA: Diagnosis present

## 2022-12-21 DIAGNOSIS — O99893 Other specified diseases and conditions complicating puerperium: Secondary | ICD-10-CM | POA: Diagnosis not present

## 2022-12-21 DIAGNOSIS — Z2839 Other underimmunization status: Secondary | ICD-10-CM

## 2022-12-21 DIAGNOSIS — Z98891 History of uterine scar from previous surgery: Secondary | ICD-10-CM

## 2022-12-21 DIAGNOSIS — Z3A36 36 weeks gestation of pregnancy: Secondary | ICD-10-CM

## 2022-12-21 DIAGNOSIS — O133 Gestational [pregnancy-induced] hypertension without significant proteinuria, third trimester: Secondary | ICD-10-CM

## 2022-12-21 DIAGNOSIS — Z7982 Long term (current) use of aspirin: Secondary | ICD-10-CM

## 2022-12-21 DIAGNOSIS — D62 Acute posthemorrhagic anemia: Secondary | ICD-10-CM | POA: Diagnosis not present

## 2022-12-21 DIAGNOSIS — O99214 Obesity complicating childbirth: Secondary | ICD-10-CM | POA: Diagnosis present

## 2022-12-21 DIAGNOSIS — Z23 Encounter for immunization: Secondary | ICD-10-CM

## 2022-12-21 DIAGNOSIS — K567 Ileus, unspecified: Secondary | ICD-10-CM | POA: Diagnosis not present

## 2022-12-21 DIAGNOSIS — O0993 Supervision of high risk pregnancy, unspecified, third trimester: Secondary | ICD-10-CM

## 2022-12-21 DIAGNOSIS — Z3A37 37 weeks gestation of pregnancy: Secondary | ICD-10-CM

## 2022-12-21 DIAGNOSIS — O139 Gestational [pregnancy-induced] hypertension without significant proteinuria, unspecified trimester: Secondary | ICD-10-CM | POA: Diagnosis present

## 2022-12-21 DIAGNOSIS — O09899 Supervision of other high risk pregnancies, unspecified trimester: Secondary | ICD-10-CM

## 2022-12-21 DIAGNOSIS — O99210 Obesity complicating pregnancy, unspecified trimester: Secondary | ICD-10-CM

## 2022-12-21 DIAGNOSIS — O9081 Anemia of the puerperium: Secondary | ICD-10-CM | POA: Diagnosis not present

## 2022-12-21 HISTORY — DX: Other specified postprocedural states: Z98.890

## 2022-12-21 LAB — COMPREHENSIVE METABOLIC PANEL
ALT: 16 U/L (ref 0–44)
AST: 19 U/L (ref 15–41)
Albumin: 3.1 g/dL — ABNORMAL LOW (ref 3.5–5.0)
Alkaline Phosphatase: 238 U/L — ABNORMAL HIGH (ref 38–126)
Anion gap: 9 (ref 5–15)
BUN: 9 mg/dL (ref 6–20)
CO2: 19 mmol/L — ABNORMAL LOW (ref 22–32)
Calcium: 8.3 mg/dL — ABNORMAL LOW (ref 8.9–10.3)
Chloride: 108 mmol/L (ref 98–111)
Creatinine, Ser: 0.6 mg/dL (ref 0.44–1.00)
GFR, Estimated: >60 mL/min (ref >60)
Glucose, Bld: 130 mg/dL — ABNORMAL HIGH (ref 70–99)
Potassium: 3.6 mmol/L (ref 3.5–5.1)
Sodium: 136 mmol/L (ref 135–145)
Total Bilirubin: 0.7 mg/dL (ref <1.2)
Total Protein: 6.7 g/dL (ref 6.5–8.1)

## 2022-12-21 LAB — CBC WITH DIFFERENTIAL/PLATELET
Abs Immature Granulocytes: 0.03 10*3/uL (ref 0.00–0.07)
Basophils Absolute: 0 10*3/uL (ref 0.0–0.1)
Basophils Relative: 0 %
Eosinophils Absolute: 0 10*3/uL (ref 0.0–0.5)
Eosinophils Relative: 0 %
HCT: 36.1 % (ref 36.0–46.0)
Hemoglobin: 12.2 g/dL (ref 12.0–15.0)
Immature Granulocytes: 0 %
Lymphocytes Relative: 15 %
Lymphs Abs: 1.3 10*3/uL (ref 0.7–4.0)
MCH: 27.6 pg (ref 26.0–34.0)
MCHC: 33.8 g/dL (ref 30.0–36.0)
MCV: 81.7 fL (ref 80.0–100.0)
Monocytes Absolute: 0.4 10*3/uL (ref 0.1–1.0)
Monocytes Relative: 4 %
Neutro Abs: 7 10*3/uL (ref 1.7–7.7)
Neutrophils Relative %: 81 %
Platelets: 289 10*3/uL (ref 150–400)
RBC: 4.42 MIL/uL (ref 3.87–5.11)
RDW: 12.6 % (ref 11.5–15.5)
WBC: 8.7 10*3/uL (ref 4.0–10.5)
nRBC: 0 % (ref 0.0–0.2)

## 2022-12-21 LAB — TYPE AND SCREEN
ABO/RH(D): O POS
Antibody Screen: NEGATIVE

## 2022-12-21 LAB — PROTEIN / CREATININE RATIO, URINE
Creatinine, Urine: 164 mg/dL
Protein Creatinine Ratio: 0.16 mg/mg{creat} — ABNORMAL HIGH (ref 0.00–0.15)
Total Protein, Urine: 26 mg/dL

## 2022-12-21 MED ORDER — LABETALOL HCL 5 MG/ML IV SOLN
20.0000 mg | INTRAVENOUS | Status: DC | PRN
  Administered 2022-12-22: 20 mg via INTRAVENOUS
  Filled 2022-12-21: qty 4

## 2022-12-21 MED ORDER — LIDOCAINE HCL (PF) 1 % IJ SOLN
INTRAMUSCULAR | Status: AC
  Filled 2022-12-21: qty 30

## 2022-12-21 MED ORDER — MISOPROSTOL 50MCG HALF TABLET
50.0000 ug | ORAL_TABLET | ORAL | Status: DC | PRN
  Administered 2022-12-21: 50 ug via ORAL
  Filled 2022-12-21: qty 1

## 2022-12-21 MED ORDER — CALCIUM CARBONATE ANTACID 500 MG PO CHEW: 2.0000 | CHEWABLE_TABLET | ORAL | Status: DC | PRN

## 2022-12-21 MED ORDER — AMMONIA AROMATIC IN INHA
RESPIRATORY_TRACT | Status: AC
  Filled 2022-12-21: qty 10

## 2022-12-21 MED ORDER — MISOPROSTOL 200 MCG PO TABS
ORAL_TABLET | ORAL | Status: AC
  Filled 2022-12-21: qty 4

## 2022-12-21 MED ORDER — LABETALOL HCL 5 MG/ML IV SOLN: 80.0000 mg | INTRAVENOUS | Status: DC | PRN

## 2022-12-21 MED ORDER — OXYTOCIN 10 UNIT/ML IJ SOLN
INTRAMUSCULAR | Status: AC
  Filled 2022-12-21: qty 2

## 2022-12-21 MED ORDER — LACTATED RINGERS IV SOLN
125.0000 mL/h | INTRAVENOUS | Status: AC
  Administered 2022-12-22: 125 mL/h via INTRAVENOUS

## 2022-12-21 MED ORDER — MISOPROSTOL 50MCG HALF TABLET
50.0000 ug | ORAL_TABLET | ORAL | Status: DC | PRN
  Administered 2022-12-21: 50 ug via VAGINAL
  Filled 2022-12-21: qty 1

## 2022-12-21 MED ORDER — LACTATED RINGERS IV BOLUS
500.0000 mL | Freq: Once | INTRAVENOUS | Status: AC
  Administered 2022-12-22: 500 mL via INTRAVENOUS

## 2022-12-21 MED ORDER — OXYTOCIN-SODIUM CHLORIDE 30-0.9 UT/500ML-% IV SOLN
INTRAVENOUS | Status: AC
  Filled 2022-12-21: qty 500

## 2022-12-21 MED ORDER — TERBUTALINE SULFATE 1 MG/ML IJ SOLN: 0.2500 mg | Freq: Once | INTRAMUSCULAR | Status: DC | PRN

## 2022-12-21 MED ORDER — HYDRALAZINE HCL 20 MG/ML IJ SOLN: 10.0000 mg | INTRAMUSCULAR | Status: DC | PRN

## 2022-12-21 MED ORDER — ACETAMINOPHEN 325 MG PO TABS
650.0000 mg | ORAL_TABLET | ORAL | Status: DC | PRN
  Administered 2022-12-24 – 2022-12-25 (×3): 650 mg via ORAL
  Filled 2022-12-21 (×3): qty 2

## 2022-12-21 MED ORDER — OXYTOCIN-SODIUM CHLORIDE 30-0.9 UT/500ML-% IV SOLN
1.0000 m[IU]/min | INTRAVENOUS | Status: DC
  Administered 2022-12-22: 2 m[IU]/min via INTRAVENOUS
  Administered 2022-12-23: 30 [IU] via INTRAVENOUS
  Filled 2022-12-21: qty 500

## 2022-12-21 MED ORDER — LABETALOL HCL 5 MG/ML IV SOLN: 40.0000 mg | INTRAVENOUS | Status: DC | PRN

## 2022-12-21 NOTE — Progress Notes (Signed)
  Labor Progress Note   27 y.o. G1P0 @ [redacted]w[redacted]d. Has received one dose of oral/PV cytotec for cervical ripening four hours ago. PIH labs were neg. Bps have remained mildly elevated but no severe pressures.    Subjective:  Reports contraction pain of 6-7/10, feeling it over her entire abdomen, about an hour ago reported pain of 4/10.   Objective:  BP (!) 141/98   Pulse 93   Temp 98.1 F (36.7 C) (Oral)   Resp 18   Ht 4\' 10"  (1.473 m)   Wt 82.5 kg   LMP 02/11/2022 (Exact Date) Comment: Had a little spotting Febuary, reported history of Polycystic Ovarian sydrome  BMI 38.02 kg/m  Abd: gravid SVE: 1cm/75%/-2, cervix is soft, stretchy, mid position  EFM: 150, mod variability, pos accels, no decels Toco: Ucs q1-61min   Assessment  G1P0 @ [redacted]w[redacted]d Early labor IOL for GHTN FHR Cat I   Plan:   1. Expectant management. Contractions are too closely spaced for repeat cytotec dosing. Will consider repeat dosing with contractions space. Also plan repeat SVE in 3-4 hours or sooner PRN.  All discussed with patient  Raeford Razor CNM, FNP [facility] 12/21/2022  11:09 PM

## 2022-12-21 NOTE — Patient Instructions (Signed)
Pain Relief During Labor and Delivery Many things can cause pain during labor and delivery, including: Pressure due to the baby moving through the pelvis. Stretching of tissues due to the baby moving through the birth canal. Muscle tension due to anxiety or nervousness. The uterus tightening (contracting)and relaxing to help move the baby. How do I get pain relief during labor and delivery?  Discuss your pain relief options with your health care provider during your prenatal visits. Explore the options offered by your hospital or birth center. There are many ways to deal with the pain of labor and delivery. You can try relaxation techniques or doing relaxing activities, taking a warm shower or bath (hydrotherapy), or other methods. There are also many medicines available to help control pain. Relaxation techniques and activities Practice relaxation techniques or do relaxing activities, such as: Focused breathing. Meditation. Visualization. Aroma therapy. Listening to your favorite music. Hypnosis. Hydrotherapy Take a warm shower or bath. This may: Provide comfort and relaxation. Lessen your feeling of pain. Reduce the amount of pain medicine needed. Shorten the length of labor. Other methods Try doing other things, such as: Getting a massage or having counterpressure on your back. Applying warm packs or ice packs. Changing positions often, moving around, or using a birthing ball. Medicines You may be given: Pain medicine through an IV or an injection into a muscle. Pain medicine inserted into your spinal column. Injections of sterile water just under the skin on your lower back. Nitrous oxide inhalation therapy, also called laughing gas. What kinds of medicine are available for pain relief? There are two kinds of medicines that can be used to relieve pain during labor and delivery: Analgesics. These medicines decrease pain without causing you to lose feeling or the ability to move  your muscles. Anesthetics. These medicines block feeling in the body and can decrease your ability to move freely. Both kinds of medicine can cause minor side effects, such as nausea, trouble concentrating, and sleepiness. They can also affect the baby's heart rate before birth and his or her breathing after birth. For this reason, health care providers are careful about when and how much medicine is given. Which medicines are used to provide pain relief? Common medicines The most common medicines used to help manage pain during labor and delivery include: Opioids. Opioids are medicines that decrease how much pain you feel (perception of pain). These medicines can be given through an IV or may be used with anesthetics to block pain. Epidural analgesia. Epidural analgesia is given through a very thin tube that is inserted into the lower back. Medicine is delivered continuously to the area near your spinal column nerves (epidural space). After having this treatment, you may be able to move your legs, but you will not be able to walk. Depending on the amount and type of medicine given, you may lose all feeling in the lower half of your body, or you may have some sensation, including the urge to push. This treatment can be used to give pain relief for a vaginal birth. Sometimes, a numbing medicine is injected into the spinal fluid when an epidural catheter is placed. This provides for immediate relief but only lasts for 1-2 hours. Once it wears off, the epidural will provide pain relief. This is called a combined spinal-epidural (CSE) block. Intrathecal analgesia (spinal analgesia). Intrathecal analgesia is similar to epidural analgesia, but the medicine is injected into the spinal fluid instead of the epidural space. It is usually only given once.  It starts to relieve pain quickly, but the pain relief lasts only 1-2 hours. Pudendal block. This block is done by injecting numbing medicine through the wall of  the vagina and into a nerve in the pelvis. Other medicines Other medicines used to help manage pain during labor and delivery include: Local anesthetics. These are used to numb a small area of the body. They may be used along with another kind of medicine or used to numb the nerves of the vagina, cervix, and perineum during the second stage of labor. Spinal block (spinal anesthesia). Spinal anesthesia is similar to spinal analgesia, but the medicine that is used contains longer-acting numbing medicines and pain medicines. This type of anesthesia can be used for a cesarean delivery and allows you to stay awake for the birth of your baby. General anesthetics cause you to lose consciousness so you do not feel pain. They are usually only used for an emergency cesarean delivery. These medicines are given through an IV or a mask or both. These medicines are used as part of a procedure or for an emergency delivery. Summary Women have many options to help them manage the pain associated with labor and delivery. You can try doing relaxing activities, taking a warm shower or bath, or other methods. There are also many medicines available to help control pain during labor and delivery. Talk with your health care provider about what options are available to you. This information is not intended to replace advice given to you by your health care provider. Make sure you discuss any questions you have with your health care provider. Document Revised: 12/29/2021 Document Reviewed: 12/29/2021 Elsevier Patient Education  2024 [employer]. Labor Induction Labor induction is when steps are taken to cause a pregnant woman to begin the labor process. Most women go into labor on their own between 37 weeks and 42 weeks of pregnancy. When this does not happen, or when there is a medical need for labor to begin, steps may be taken to induce, or bring on, labor. Labor induction causes a pregnant woman's uterus to contract.  It also causes the cervix to soften (ripen), open (dilate), and thin out. Usually, labor is not induced before 39 weeks of pregnancy unless there is a medical reason to do so. When is labor induction considered? Labor induction may be right for you if: Your pregnancy lasts longer than 41 to 42 weeks. Your placenta is separating from your uterus (placental abruption). You have a rupture of membranes and your labor does not begin. You have health problems, like diabetes or high blood pressure (preeclampsia) during your pregnancy. Your baby has stopped growing or does not have enough amniotic fluid. Before labor induction begins, your health care provider will consider the following factors: Your medical condition and the baby's condition. How many weeks you have been pregnant. How mature the baby's lungs are. The condition of your cervix. The position of the baby. The size of your birth canal. Tell a health care provider about: Any allergies you have. All medicines you are taking, including vitamins, herbs, eye drops, creams, and over-the-counter medicines. Any problems you or your family members have had with anesthetic medicines. Any surgeries you have had. Any blood disorders you have. Any medical conditions you have. What are the risks? Generally, this is a safe procedure. However, problems may occur, including: Failed induction. Changes in fetal heart rate, such as being too high, too low, or irregular (erratic). Infection in the mother or the baby.  Increased risk of having a cesarean delivery. Breaking off (abruption) of the placenta from the uterus. This is rare. Rupture of the uterus. This is very rare. Your baby could fail to get enough blood flow or oxygen. This can be life-threatening. When induction is needed for medical reasons, the benefits generally outweigh the risks. What happens during the procedure? During the procedure, your health care provider will use one of  these methods to induce labor: Stripping the membranes. In this method, the amniotic sac tissue is gently separated from the cervix. This causes the following to happen: Your cervix stretches, which in turn causes the release of prostaglandins. Prostaglandins induce labor and cause the uterus to contract. This procedure is often done in an office visit. You will be sent home to wait for contractions to begin. Prostaglandin medicine. This medicine starts contractions and causes the cervix to dilate and ripen. This can be taken by mouth (orally) or by being inserted into the vagina (suppository). Inserting a small, thin tube (catheter) with a balloon into the vagina and then expanding the balloon with water to dilate the cervix. Breaking the water. In this method, a small instrument is used to make a small hole in the amniotic sac. This eventually causes the amniotic sac to break. Contractions should begin within a few hours. Medicine to trigger or strengthen contractions. This medicine is given through an IV that is inserted into a vein in your arm. This procedure may vary among health care providers and hospitals. Where to find more information [employer]: www.marchofdimes.org [employer]ologists: www.acog.org Summary Labor induction causes a pregnant woman's uterus to contract. It also causes the cervix to soften (ripen), open (dilate), and thin out. Labor is usually not induced before 39 weeks of pregnancy unless there is a medical reason to do so. When induction is needed for medical reasons, the benefits generally outweigh the risks. Talk with your health care provider about which methods of labor induction are right for you. This information is not intended to replace advice given to you by your health care provider. Make sure you discuss any questions you have with your health care provider. Document Revised: 10/19/2019 Document Reviewed:  10/19/2019 Elsevier Patient Education  2024 [employer].

## 2022-12-21 NOTE — H&P (Signed)
History and Physical   HPI  Kim Maxwell is a 27 y.o. G1P0 at [redacted]w[redacted]d Estimated Date of Delivery: 01/08/23 by 6wk u/s, who is being admitted for induction of labor secondary to [facility]. Reports good fetal movement. Denies painful contractions, VB, LOF, HA, visual changes or RUQ pain.   Kim Maxwell started prenatal care at 13 weeks. Had consistent and routine care complicated by obesity, rubella non immune status, varicella non immune status and more recently gestational hypertension. She had her first elevated blood pressure at 34 weeks although it was mildly elevated. At [redacted] wks EGA she had a BP in the severe range of 144/112 but repeat x2 was only mildly elevated. Today she was seen in clinic for a ROB and her initial pressure was 155/110. Recheck was 146/109. At both 34 and 36 weeks she had PIH labs done which were all WNL. More recently she had stared having significant edema to her lower abdomen. A decision was made at her ROB to have her induced for [facility] with non concurrent repeated severe range pressures.   Last growth scan was on 12/14/22 and had a EFW of 80%, BPP was 8/8 at that time.   OB History  OB History  Gravida Para Term Preterm AB Living  1 0 0 0 0 0  SAB IAB Ectopic Multiple Live Births  0 0 0 0 0    # Outcome Date GA Lbr Len/2nd Weight Sex Type Anes PTL Lv  1 Current             Obstetric Comments  History of Polycystic Ovarian Syndrome.  Stated she had last regular period 02/11/2022, and reported a little spotting in Feb. No period in March.      PROBLEM LIST  Pregnancy complications or risks: Patient Active Problem List   Diagnosis Date Noted   Elevated blood pressure affecting pregnancy in third trimester, antepartum 12/14/2022   [redacted] weeks gestation of pregnancy 12/14/2022   Gestational hypertension 11/30/2022   Pregnancy 11/30/2022   Maternal varicella, non-immune 11/02/2022   Rubella non-immune status, antepartum 11/02/2022   Obesity in pregnancy  05/21/2022   Supervision of high-risk pregnancy 05/14/2022    Prenatal labs and studies: ABO, Rh: O/Positive/-- (05/24 4782) Antibody: Negative (05/24 0922) Rubella: <0.90 (05/24 0922) RPR: Non Reactive (09/30 0933)  HBsAg: Negative (05/24 0922)  HIV: Non Reactive (09/30 0933)  NFA:OZHYQMVH/-- (11/25 0908) Varicella non immune 1hr GTT: 100 GC/CT neg/neg H&H:  Hemoglobin  Date Value Ref Range Status  12/21/2022 12.2 12.0 - 15.0 g/dL Final  84/69/6295 28.4 (L) 12.0 - 15.0 g/dL Final  13/24/4010 27.2 12.0 - 15.0 g/dL Final  53/66/4403 47.4 11.1 - 15.9 g/dL Final  25/95/6387 56.4 11.1 - 15.9 g/dL Final  33/29/5188 41.6 12.0 - 15.0 g/dL Final   HGB  Date Value Ref Range Status  02/12/2013 14.5 12.0 - 16.0 g/dL Final     Past Medical History:  Diagnosis Date   Polycystic ovarian syndrome      Past Surgical History:  Procedure Laterality Date   ESOPHAGOGASTRODUODENOSCOPY (EGD) WITH PROPOFOL N/A 03/18/2020   Procedure: ESOPHAGOGASTRODUODENOSCOPY (EGD) WITH PROPOFOL;  Surgeon: [name], MD;  Location: [facility] ENDOSCOPY;  Service: Gastroenterology;  Laterality: N/A;     Medications    Current Discharge Medication List     CONTINUE these medications which have NOT CHANGED   Details  aspirin 81 MG chewable tablet Chew 1 tablet (81 mg total) by mouth daily.    potassium chloride (KLOR-CON M)  10 MEQ tablet Take 1 tablet (10 mEq total) by mouth daily for 14 days. Qty: 14 tablet, Refills: 0         Allergies  Patient has no known allergies.  Review of Systems  Pertinent items are noted in HPI.  Physical Exam  BP (!) 154/107   Pulse (!) 119   Temp 98.1 F (36.7 C) (Oral)   Resp 18   Ht 4\' 10"  (1.473 m)   Wt 82.5 kg   LMP 02/11/2022 (Exact Date) Comment: Had a little spotting Febuary, reported history of Polycystic Ovarian sydrome  BMI 38.02 kg/m   Lungs:  CTA B Cardio: S1S2, RRR Abd: Soft, gravid, NT Presentation: cephalic DTRs: 2+ B SVE:1cm/50%/-2,  vertex, soft, mid position  FHR 145, mod variability, pos accels, no decels Toco Ucs q10-58min, patient not feeling them   Test Results  Results for orders placed or performed during the hospital encounter of 12/21/22 (from the past 24 hour(s))  CBC with Differential/Platelet     Status: None   Collection Time: 12/21/22  6:14 PM  Result Value Ref Range   WBC 8.7 4.0 - 10.5 K/uL   RBC 4.42 3.87 - 5.11 MIL/uL   Hemoglobin 12.2 12.0 - 15.0 g/dL   HCT 16.1 09.6 - 04.5 %   MCV 81.7 80.0 - 100.0 fL   MCH 27.6 26.0 - 34.0 pg   MCHC 33.8 30.0 - 36.0 g/dL   RDW 40.9 81.1 - 91.4 %   Platelets 289 150 - 400 K/uL   nRBC 0.0 0.0 - 0.2 %   Neutrophils Relative % 81 %   Neutro Abs 7.0 1.7 - 7.7 K/uL   Lymphocytes Relative 15 %   Lymphs Abs 1.3 0.7 - 4.0 K/uL   Monocytes Relative 4 %   Monocytes Absolute 0.4 0.1 - 1.0 K/uL   Eosinophils Relative 0 %   Eosinophils Absolute 0.0 0.0 - 0.5 K/uL   Basophils Relative 0 %   Basophils Absolute 0.0 0.0 - 0.1 K/uL   Immature Granulocytes 0 %   Abs Immature Granulocytes 0.03 0.00 - 0.07 K/uL   Group B Strep negative  Assessment  G1P0 at [redacted]w[redacted]d Estimated Date of Delivery: 01/08/23  RNST Not in labor GHTN with hx of severe pressures although BP is currently mildly elevated GBS neg Bishop score 6  Patient Active Problem List   Diagnosis Date Noted   Elevated blood pressure affecting pregnancy in third trimester, antepartum 12/14/2022   [redacted] weeks gestation of pregnancy 12/14/2022   Gestational hypertension 11/30/2022   Pregnancy 11/30/2022   Maternal varicella, non-immune 11/02/2022   Rubella non-immune status, antepartum 11/02/2022   Obesity in pregnancy 05/21/2022   Supervision of high-risk pregnancy 05/14/2022    Plan  1. Admit to L&D   2. EFM per unit policy 3. Labs : T&S, CBC, RPR, CMP, PC ratio 4. Dr. Logan Bores notified of patient admission and status  5. Will start IOL with misoprostol for cervical ripening 6. Will rule out/in  preeclampsia  Raeford Razor, CNM, FNP 12/21/2022 6:51 PM

## 2022-12-21 NOTE — Progress Notes (Signed)
Routine Prenatal Care Visit  Subjective  Kim Maxwell is a 27 y.o. G1P0 at [redacted]w[redacted]d being seen today for ongoing prenatal care.  She is currently monitored for the following issues for this high-risk pregnancy and has Supervision of high-risk pregnancy; Obesity in pregnancy; Maternal varicella, non-immune; Rubella non-immune status, antepartum; Gestational hypertension; Pregnancy; Elevated blood pressure affecting pregnancy in third trimester, antepartum; and [redacted] weeks gestation of pregnancy on their problem list.  ----------------------------------------------------------------------------------- Patient reports some generalized discomforts. She denies severe headache, visual changes or epigastric pain. Initial blood pressure today was 155 /110. Repeat slightly better. She has been to L&D 2 weeks in a row for PIH eval- both normal. I asked Dr Lonny Prude for guidance on plan of care. She recommends that patient be induced today given that she is full term to avoid the potential for worsening of symptoms. Patient agrees with plan of care. On call CNM and L&D notified.   Contractions: Not present. Vag. Bleeding: None.  Movement: Present. Leaking Fluid denies.  ----------------------------------------------------------------------------------- The following portions of the patient's history were reviewed and updated as appropriate: allergies, current medications, past family history, past medical history, past social history, past surgical history and problem list. Problem list updated.  Objective  Blood pressure (!) 146/109, pulse 93, weight 181 lb 14.4 oz (82.5 kg), last menstrual period 02/11/2022. Pregravid weight 145 lb (65.8 kg) Total Weight Gain 36 lb 14.4 oz (16.7 kg) Urinalysis: Urine Protein    Urine Glucose    Fetal Status: Fetal Heart Rate (bpm): 152   Movement: Present     General:  Alert, oriented and cooperative. Patient is in no acute distress.  Skin: Skin is warm and dry. No rash noted.    Cardiovascular: Normal heart rate noted  Respiratory: Normal respiratory effort, no problems with respiration noted  Abdomen: Soft, gravid, appropriate for gestational age. Pain/Pressure: Present     Pelvic:  Cervical exam deferred        Extremities: Normal range of motion.  Edema: Trace  Mental Status: Normal mood and affect. Normal behavior. Normal judgment and thought content.   Assessment   27 y.o. G1P0 at [redacted]w[redacted]d by  01/08/2023, by Ultrasound presenting for routine prenatal visit  Plan   first Problems (from 05/14/22 to present)     Problem Noted Resolved   [redacted] weeks gestation of pregnancy 12/14/2022 by Tresea Mall, CNM No   Gestational hypertension 11/30/2022 by Burney Gauze, CNM No   Overview Addendum 12/14/2022  8:47 AM by Burney Gauze, CNM    -At 34 weeks, 2 MRBPs in clinic, sent to L&D for PIH work up, [facility] labs WNL, blood pressures stable in triage - at [redacted]w[redacted]d, MRBP at [facility], now meets criteria for gHTN.       Maternal varicella, non-immune 11/02/2022 by Free, Lindalou Hose, CNM No   Rubella non-immune status, antepartum 11/02/2022 by Free, Lindalou Hose, CNM No   Obesity in pregnancy 05/21/2022 by Ellwood Sayers, CNM No   Supervision of high-risk pregnancy 05/14/2022 by Loran Senters, CMA No   Overview Addendum 11/02/2022  9:19 AM by Burney Gauze, CNM     Clinical Staff Provider  Office Location  [facility] Dating  6wk [place]  Language  English Anatomy US  Incomplete at 19 wks- normal fu  Flu Vaccine  10/14 Genetic Screen  NIPS: negative, female  TDaP vaccine   10/14 Hgb A1C or  GTT Early : Third trimester :   Covid Had booster last year   LAB  RESULTS   Rhogam  O/Positive/-- (05/24 1610)  Blood Type O/Positive/-- (05/24 9604)   Feeding Plan breast Antibody Negative (05/24 5409)  Contraception Condoms Rubella <0.90 (05/24 8119)  Circumcision undecided RPR Non Reactive (05/24 0922)   Pediatrician  undecided HBsAg Negative (05/24 0922)   Support Person Griffen  HIV Non  Reactive (05/24 1478)  Prenatal Classes yes Varicella Non immune    GBS  (For PCN allergy, check sensitivities)   BTL Consent  Hep C Non Reactive (05/24 2956)   VBAC Consent  Pap Diagnosis  Date Value Ref Range Status  05/21/2022   Final   - Negative for intraepithelial lesion or malignancy (NILM)      Hgb Electro      CF      SMA                    Term labor symptoms and general obstetric precautions including but not limited to vaginal bleeding, contractions, leaking of fluid and fetal movement were reviewed in detail with the patient. Please refer to After Visit Summary for other counseling recommendations.   Return for: recommend IOL today.  Tresea Mall, CNM 12/21/2022 5:21 PM

## 2022-12-22 ENCOUNTER — Inpatient Hospital Stay: Payer: Medicaid Other | Admitting: Anesthesiology

## 2022-12-22 DIAGNOSIS — O133 Gestational [pregnancy-induced] hypertension without significant proteinuria, third trimester: Secondary | ICD-10-CM | POA: Diagnosis not present

## 2022-12-22 DIAGNOSIS — Z3A37 37 weeks gestation of pregnancy: Secondary | ICD-10-CM | POA: Diagnosis not present

## 2022-12-22 LAB — RPR: RPR Ser Ql: NONREACTIVE

## 2022-12-22 MED ORDER — FENTANYL-BUPIVACAINE-NACL 0.5-0.125-0.9 MG/250ML-% EP SOLN
EPIDURAL | Status: AC
  Filled 2022-12-22: qty 250

## 2022-12-22 MED ORDER — PHENYLEPHRINE 80 MCG/ML (10ML) SYRINGE FOR IV PUSH (FOR BLOOD PRESSURE SUPPORT)
80.0000 ug | PREFILLED_SYRINGE | INTRAVENOUS | Status: DC | PRN
  Administered 2022-12-23: 80 ug via INTRAVENOUS

## 2022-12-22 MED ORDER — LIDOCAINE-EPINEPHRINE (PF) 1.5 %-1:200000 IJ SOLN
INTRAMUSCULAR | Status: DC | PRN
  Administered 2022-12-22 – 2022-12-23 (×2): 3 mL via EPIDURAL

## 2022-12-22 MED ORDER — FENTANYL CITRATE (PF) 100 MCG/2ML IJ SOLN
50.0000 ug | INTRAMUSCULAR | Status: DC | PRN
  Administered 2022-12-22: 100 ug via INTRAVENOUS
  Administered 2022-12-22: 50 ug via INTRAVENOUS
  Filled 2022-12-22 (×2): qty 2

## 2022-12-22 MED ORDER — LACTATED RINGERS IV SOLN: INTRAVENOUS | Status: AC

## 2022-12-22 MED ORDER — DIPHENHYDRAMINE HCL 50 MG/ML IJ SOLN: 12.5000 mg | INTRAMUSCULAR | Status: DC | PRN

## 2022-12-22 MED ORDER — EPHEDRINE 5 MG/ML INJ: 10.0000 mg | INTRAVENOUS | Status: DC | PRN

## 2022-12-22 MED ORDER — FENTANYL-BUPIVACAINE-NACL 0.5-0.125-0.9 MG/250ML-% EP SOLN
12.0000 mL/h | EPIDURAL | Status: DC | PRN
  Administered 2022-12-22: 10 mL/h via EPIDURAL

## 2022-12-22 MED ORDER — LACTATED RINGERS IV SOLN: 500.0000 mL | Freq: Once | INTRAVENOUS | Status: DC

## 2022-12-22 MED ORDER — PHENYLEPHRINE 80 MCG/ML (10ML) SYRINGE FOR IV PUSH (FOR BLOOD PRESSURE SUPPORT): 80.0000 ug | PREFILLED_SYRINGE | INTRAVENOUS | Status: DC | PRN

## 2022-12-22 MED ORDER — SODIUM CHLORIDE 0.9 % IV SOLN
INTRAVENOUS | Status: DC | PRN
  Administered 2022-12-22 – 2022-12-23 (×4): 4 mL via EPIDURAL

## 2022-12-22 MED ORDER — LIDOCAINE HCL (PF) 1 % IJ SOLN
INTRAMUSCULAR | Status: DC | PRN
  Administered 2022-12-22 – 2022-12-23 (×2): 3 mL via SUBCUTANEOUS

## 2022-12-22 NOTE — Anesthesia Preprocedure Evaluation (Signed)
Anesthesia Evaluation  Patient identified by MRN, date of birth, ID band Patient awake    Reviewed: Allergy & Precautions, NPO status , Patient's Chart, lab work & pertinent test results  History of Anesthesia Complications Negative for: history of anesthetic complications  Airway Mallampati: III  TM Distance: <3 FB Neck ROM: full    Dental  (+) Chipped   Pulmonary neg pulmonary ROS   Pulmonary exam normal        Cardiovascular Exercise Tolerance: Good hypertension, Normal cardiovascular exam     Neuro/Psych    GI/Hepatic negative GI ROS,,,  Endo/Other    Renal/GU   negative genitourinary   Musculoskeletal   Abdominal   Peds  Hematology negative hematology ROS (+)   Anesthesia Other Findings Past Medical History: No date: Polycystic ovarian syndrome  Past Surgical History: 03/18/2020: ESOPHAGOGASTRODUODENOSCOPY (EGD) WITH PROPOFOL; N/A     Comment:  Procedure: ESOPHAGOGASTRODUODENOSCOPY (EGD) WITH               PROPOFOL;  Surgeon: [name], MD;  Location: [facility]               ENDOSCOPY;  Service: Gastroenterology;  Laterality: N/A;  BMI    Body Mass Index: 38.02 kg/m      Reproductive/Obstetrics (+) Pregnancy                             Anesthesia Physical Anesthesia Plan  ASA: 3  Anesthesia Plan: Epidural   Post-op Pain Management:    Induction:   PONV Risk Score and Plan:   Airway Management Planned: Natural Airway  Additional Equipment:   Intra-op Plan:   Post-operative Plan:   Informed Consent: I have reviewed the patients History and Physical, chart, labs and discussed the procedure including the risks, benefits and alternatives for the proposed anesthesia with the patient or authorized representative who has indicated his/her understanding and acceptance.     Dental Advisory Given  Plan Discussed with: Anesthesiologist  Anesthesia Plan Comments:  (Patient reports no bleeding problems and no anticoagulant use.   Patient consented for risks of anesthesia including but not limited to:  - adverse reactions to medications - risk of bleeding, infection and or nerve damage from epidural that could lead to paralysis - risk of headache or failed epidural - nerve damage due to positioning - that if epidural is used for C-section that there is a chance of epidural failure requiring spinal placement or conversion to GA - Damage to heart, brain, lungs, other parts of body or loss of life  Patient voiced understanding and assent.)       Anesthesia Quick Evaluation

## 2022-12-22 NOTE — Progress Notes (Signed)
LABOR NOTE   Kim Maxwell 27 y.o.GP@ at [redacted]w[redacted]d  SUBJECTIVE:  Uncomfortable with contractions.  Analgesia: Nitrous Oxide  OBJECTIVE:  BP (!) 150/97   Pulse (!) 116   Temp 97.9 F (36.6 C) (Oral)   Resp 20   Ht 4\' 10"  (1.473 m)   Wt 82.5 kg   LMP 02/11/2022 (Exact Date) Comment: Had a little spotting Febuary, reported history of Polycystic Ovarian sydrome  SpO2 99%   BMI 38.02 kg/m  No intake/output data recorded.  She has not shown cervical change. CERVIX: 6 cm can stretch to 7 cm:  70%:   -3:   mid position:   soft SVE:   Dilation: 7 Effacement (%): 70 Station: -2 Exam by:: A. Oliviagrace Crisanti CNM CONTRACTIONS: regular, every 2-3 minutes FHR: Fetal heart tracing reviewed. Baseline: 145 bpm, Variability: Good {> 6 bpm), Accelerations: Reactive, and Decelerations: Absent Category I    Labs: Lab Results  Component Value Date   WBC 8.7 12/21/2022   HGB 12.2 12/21/2022   HCT 36.1 12/21/2022   MCV 81.7 12/21/2022   PLT 289 12/21/2022    ASSESSMENT: 1) Labor curve reviewed.       Progress: Prolonged active phase labor.     Membranes: ruptured, clear fluid            Principal Problem:   Gestational hypertension     Overview: -At 34 weeks, 2 MRBPs in clinic, sent to L&D for PIH work up, PIH labs      WNL, blood pressures stable in triage     - at [redacted]w[redacted]d, MRBP at [facility], now meets criteria for gHTN.    PLAN: Discussed with pt and her family that she has not made cervical change. Reviwed that molding of fetal head noted on exam , the head remain -2 to -3 ( not well engaged). Discussed pitocin nearly at maximum dose. Recommend use of IUPC, pt refuses at this time. Discussed epidural to see if it will help her relax and possible allow for cervical change. She would like to try an epidural. Plan is to increase pitocin to 30 mu, get epidural and rest x 2 hours. Then re evaluate. If no cervical change will proceed with cesarean delivery. Pt and her partner agree to plan. Dr. Logan Bores  updated with progress and plan. He is in agreement.    [name], CNM  12/22/2022 11:17 PM

## 2022-12-22 NOTE — Progress Notes (Signed)
LABOR NOTE   Kim Maxwell 27 y.o.GP@ at [redacted]w[redacted]d  SUBJECTIVE:  Pt state she is not feeling a lot of contractions at this time. Analgesia: Labor support without medications  OBJECTIVE:  BP (!) 149/93   Pulse 90   Temp 97.7 F (36.5 C) (Oral)   Resp 16   Ht 4\' 10"  (1.473 m)   Wt 82.5 kg   LMP 02/11/2022 (Exact Date) Comment: Had a little spotting Febuary, reported history of Polycystic Ovarian sydrome  BMI 38.02 kg/m  Total I/O In: 120 [P.O.:120] Out: -   She has shown cervical change. CERVIX: 5.5 cm:  70:   -2:   mid position:   firm SVE:   Dilation: 1 Effacement (%): 70 Station: -2 Exam by:: [name] CNM CONTRACTIONS: regular, every 2-4 minutes FHR: Fetal heart tracing reviewed. Baseline: 130 bpm, Variability: Good {> 6 bpm), Accelerations: Reactive, and Decelerations: Absent Category I  Cooks catheter came out approximately 1500  Labs: Lab Results  Component Value Date   WBC 8.7 12/21/2022   HGB 12.2 12/21/2022   HCT 36.1 12/21/2022   MCV 81.7 12/21/2022   PLT 289 12/21/2022    ASSESSMENT: 1) Labor curve reviewed.       Progress: Early latent labor.     Membranes: artificial ruptured, clear fluid           Principal Problem:   Gestational hypertension     Overview: -At 34 weeks, 2 MRBPs in clinic, sent to L&D for PIH work up, PIH labs      WNL, blood pressures stable in triage     - at [redacted]w[redacted]d, MRBP at [facility], now meets criteria for gHTN.    PLAN: continue present management and increase Pitocin rate Dr. Logan Bores notified of pt progress  [name], CNM  12/22/2022 4:19 PM

## 2022-12-22 NOTE — Progress Notes (Signed)
  Labor Progress Note   27 y.o. G1P0 @ [redacted]w[redacted]d. Had had one dose of misoprostol for cervical ripening.    Subjective:  Reports pain 7-10 with contractions.  Objective:  BP (!) 143/101   Pulse 93   Temp 98.1 F (36.7 C) (Oral)   Resp 18   Ht 4\' 10"  (1.473 m)   Wt 82.5 kg   LMP 02/11/2022 (Exact Date) Comment: Had a little spotting Febuary, reported history of Polycystic Ovarian sydrome  BMI 38.02 kg/m  Abd: gravid SVE: 1cm/75%/-2, vertex  EFM: 140, mod variability, pos accels, no decels Toco: Ucs q1-62min   Assessment  G1P0 @ [redacted]w[redacted]d Early labor Mild elevated blood pressure, other VSS FHR Cat I   Plan:   1. Expectant management, no change since last SVE but was only two hours ago. Will continue to monitor. If contractions space will consider additional cytotec dosing.  All discussed with patient  Raeford Razor CNM, FNP [facility] 12/22/2022  1:26 AM

## 2022-12-22 NOTE — Progress Notes (Signed)
LABOR NOTE   Kim Maxwell 27 y.o.GP@ at [redacted]w[redacted]d  SUBJECTIVE:  Cramping with contractions Analgesia: Labor support without medications  OBJECTIVE:  BP (!) 148/100 (BP Location: Right Arm)   Pulse 88   Temp 98.3 F (36.8 C) (Oral)   Resp 16   Ht 4\' 10"  (1.473 m)   Wt 82.5 kg   LMP 02/11/2022 (Exact Date) Comment: Had a little spotting Febuary, reported history of Polycystic Ovarian sydrome  BMI 38.02 kg/m  No intake/output data recorded.  She has not shown cervical change. CERVIX: 1 cm:  50%:   -2:   posterior:   firm SVE:   Dilation: 1 Effacement (%): 70 Station: -2 Exam by:: [name] CNM CONTRACTIONS: regular, every 2-3 minutes FHR: Fetal heart tracing reviewed. Baseline: 130 bpm, Variability: Good {> 6 bpm), Accelerations: Reactive, and Decelerations: Absent Category I    Labs: Lab Results  Component Value Date   WBC 8.7 12/21/2022   HGB 12.2 12/21/2022   HCT 36.1 12/21/2022   MCV 81.7 12/21/2022   PLT 289 12/21/2022    ASSESSMENT: 1) Labor curve reviewed.       Progress: Not in labor.     Membranes: intact            Principal Problem:   Gestational hypertension     Overview: -At 34 weeks, 2 MRBPs in clinic, sent to L&D for PIH work up, PIH labs      WNL, blood pressures stable in triage     - at [redacted]w[redacted]d, MRBP at [facility], now meets criteria for gHTN.    PLAN: Discussed use of cooks catheter for augmentation. Reviewed risks and benefits of use. Pt and her partner are in agreement. Cooks catheter placed Without difficulty. Pt tolerated well.   [name], CNM  12/22/2022 8:45 AM

## 2022-12-22 NOTE — Progress Notes (Signed)
LABOR NOTE   Kim Maxwell 27 y.o.GP@ at [redacted]w[redacted]d  SUBJECTIVE:  Uncomfortable , on her right side breathing with contractions.  Analgesia: IV pain meds  OBJECTIVE:  BP (!) 148/90   Pulse 76   Temp 97.7 F (36.5 C) (Oral)   Resp 16   Ht 4\' 10"  (1.473 m)   Wt 82.5 kg   LMP 02/11/2022 (Exact Date) Comment: Had a little spotting Febuary, reported history of Polycystic Ovarian sydrome  BMI 38.02 kg/m  Total I/O In: 120 [P.O.:120] Out: -    CERVIX: not evaluated: cooks catheter in place  SVE:   Dilation: 1 Effacement (%): 70 Station: -2 Exam by:: [name] CNM CONTRACTIONS: regular, every 2 minutes FHR: Fetal heart tracing reviewed. Baseline: 135 bpm, Variability: Good {> 6 bpm), Accelerations: Reactive, and Decelerations: Absent Category I    Labs: Lab Results  Component Value Date   WBC 8.7 12/21/2022   HGB 12.2 12/21/2022   HCT 36.1 12/21/2022   MCV 81.7 12/21/2022   PLT 289 12/21/2022    ASSESSMENT: 1) Labor curve reviewed.       Progress: Not in labor.     Membranes: intact      Principal Problem:   Gestational hypertension     Overview: -At 34 weeks, 2 MRBPs in clinic, sent to L&D for PIH work up, PIH labs      WNL, blood pressures stable in triage     - at [redacted]w[redacted]d, MRBP at [facility], now meets criteria for gHTN.    PLAN: continue present management   [name], CNM  12/22/2022 12:23 PM

## 2022-12-22 NOTE — Anesthesia Procedure Notes (Signed)
Epidural Patient location during procedure: OB Start time: 12/22/2022 11:15 PM End time: 12/22/2022 11:21 PM  Staffing Anesthesiologist: Elyon Zoll, Cleda Mccreedy, MD Performed: anesthesiologist   Preanesthetic Checklist Completed: patient identified, IV checked, site marked, risks and benefits discussed, surgical consent, monitors and equipment checked, pre-op evaluation and timeout performed  Epidural Patient position: sitting Prep: ChloraPrep Patient monitoring: heart rate, continuous pulse ox and blood pressure Approach: midline Location: L3-L4 Injection technique: LOR saline  Needle:  Needle type: Tuohy  Needle gauge: 17 G Needle length: 9 cm and 9 Needle insertion depth: 5.5 cm Catheter type: closed end flexible Catheter size: 19 Gauge Catheter at skin depth: 10.5 cm Test dose: negative and 1.5% lidocaine with Epi 1:200 K  Assessment Sensory level: T10 Events: blood not aspirated, no cerebrospinal fluid, injection not painful, no injection resistance, no paresthesia and negative IV test  Additional Notes 1 attempt Pt. Evaluated and documentation done after procedure finished. Patient identified. Risks/Benefits/Options discussed with patient including but not limited to bleeding, infection, nerve damage, paralysis, failed block, incomplete pain control, headache, blood pressure changes, nausea, vomiting, reactions to medication both or allergic, itching and postpartum back pain. Confirmed with bedside nurse the patient's most recent platelet count. Confirmed with patient that they are not currently taking any anticoagulation, have any bleeding history or any family history of bleeding disorders. Patient expressed understanding and wished to proceed. All questions were answered. Sterile technique was used throughout the entire procedure. Please see nursing notes for vital signs. Test dose was given through epidural catheter and negative prior to continuing to dose epidural or start  infusion. Warning signs of high block given to the patient including shortness of breath, tingling/numbness in hands, complete motor block, or any concerning symptoms with instructions to call for help. Patient was given instructions on fall risk and not to get out of bed. All questions and concerns addressed with instructions to call with any issues or inadequate analgesia.   Patient tolerated the insertion well without immediate complications.Reason for block:procedure for pain

## 2022-12-22 NOTE — Progress Notes (Signed)
LABOR NOTE   Kim Maxwell 27 y.o.GP@ at [redacted]w[redacted]d  SUBJECTIVE:  Very uncomfortable with contractions.  Analgesia: Labor support without medications  OBJECTIVE:  BP (!) 135/101 (BP Location: Right Arm)   Pulse (!) 109   Temp 97.7 F (36.5 C) (Oral)   Resp 20   Ht 4\' 10"  (1.473 m)   Wt 82.5 kg   LMP 02/11/2022 (Exact Date) Comment: Had a little spotting Febuary, reported history of Polycystic Ovarian sydrome  BMI 38.02 kg/m  No intake/output data recorded.  She has shown cervical change. CERVIX: 6-7cm:  70%:   -3:   mid position:   firm ( difficult to evaluate due to pts poor tolerance of exam).  SVE:   Dilation: 7 Effacement (%): 70 Station: -2 Exam by:: A. Ithiel Liebler CNM CONTRACTIONS: regular, every 2 minutes FHR: Fetal heart tracing reviewed. Baseline: 145 bpm, Variability: Good {> 6 bpm), Accelerations: Reactive, and Decelerations: Absent Category I    Labs: Lab Results  Component Value Date   WBC 8.7 12/21/2022   HGB 12.2 12/21/2022   HCT 36.1 12/21/2022   MCV 81.7 12/21/2022   PLT 289 12/21/2022    ASSESSMENT: 1) Labor curve reviewed.       Progress: Active phase labor.     Membranes: ruptured, clear fluid            Principal Problem:   Gestational hypertension     Overview: -At 34 weeks, 2 MRBPs in clinic, sent to L&D for PIH work up, PIH labs      WNL, blood pressures stable in triage     - at [redacted]w[redacted]d, MRBP at [facility], now meets criteria for gHTN.    PLAN: continue present management and FSE placed due to difficulty monitoring FHR. Pt is moving around in room and bed due to ctx pain making if difficult to trace fetal heart rate. Discussed concern with pt and her family and recommendation for FSE placement to better evaluate heart rate. Discussed risk and benefits of FSE placement. Pt and her family are in agreement to placement. Dr. Logan Bores updated on pt progress.   [name] , CNM  12/22/2022 7:40 PM

## 2022-12-22 NOTE — Progress Notes (Signed)
  Labor Progress Note   27 y.o. G1P0 @ [redacted]w[redacted]d. Patient received one dose of cytotec for cervical ripening. Had been contracting too regularly for repeat dosing.   Subjective:  Rates pain 8/10 with contractions, pain is now mostly to lower front abdomen.  Objective:  BP (!) 150/98   Pulse 93   Temp 98 F (36.7 C) (Oral)   Resp 18   Ht 4\' 10"  (1.473 m)   Wt 82.5 kg   LMP 02/11/2022 (Exact Date) Comment: Had a little spotting Febuary, reported history of Polycystic Ovarian sydrome  BMI 38.02 kg/m  Abd: gravid SVE: 1cm/75%/-2, vertex  EFM: 135, mod variability, pos accels, no decels Toco: Ucs q1-2.43min   Assessment  G1P0 @ [redacted]w[redacted]d IOL for GHTN VSS FHR Cat I   Plan:   1. Will start pitocin to help make contractions stronger and titrate as needed  All discussed with patient  Raeford Razor CNM, FNP [facility] 12/22/2022  6:32 AM

## 2022-12-22 NOTE — Progress Notes (Signed)
Pt advised of need to monitor fetal HR. Pt desires full movement. CNM notified and FSE discussed with patient.

## 2022-12-23 ENCOUNTER — Encounter
Admission: AD | Disposition: A | Discharge status: Home / Self Care | Payer: Self-pay | Source: Ambulatory Visit | Attending: Certified Nurse Midwife

## 2022-12-23 ENCOUNTER — Encounter: Payer: Self-pay | Admitting: Family

## 2022-12-23 ENCOUNTER — Other Ambulatory Visit: Payer: Medicaid Other

## 2022-12-23 ENCOUNTER — Other Ambulatory Visit: Payer: Self-pay

## 2022-12-23 DIAGNOSIS — Z3A37 37 weeks gestation of pregnancy: Secondary | ICD-10-CM | POA: Diagnosis not present

## 2022-12-23 DIAGNOSIS — O134 Gestational [pregnancy-induced] hypertension without significant proteinuria, complicating childbirth: Secondary | ICD-10-CM

## 2022-12-23 LAB — CBC
HCT: 27.3 % — ABNORMAL LOW (ref 36.0–46.0)
Hemoglobin: 9.3 g/dL — ABNORMAL LOW (ref 12.0–15.0)
MCH: 27.9 pg (ref 26.0–34.0)
MCHC: 34.1 g/dL (ref 30.0–36.0)
MCV: 82 fL (ref 80.0–100.0)
Platelets: 215 10*3/uL (ref 150–400)
RBC: 3.33 MIL/uL — ABNORMAL LOW (ref 3.87–5.11)
RDW: 12.6 % (ref 11.5–15.5)
WBC: 15.9 10*3/uL — ABNORMAL HIGH (ref 4.0–10.5)
nRBC: 0.1 % (ref 0.0–0.2)

## 2022-12-23 LAB — ABO/RH: ABO/RH(D): O POS

## 2022-12-23 SURGERY — Surgical Case
Anesthesia: General

## 2022-12-23 MED ORDER — ZOLPIDEM TARTRATE 5 MG PO TABS: 5.0000 mg | ORAL_TABLET | Freq: Every evening | ORAL | Status: DC | PRN

## 2022-12-23 MED ORDER — SODIUM CHLORIDE 0.9 % IV SOLN
INTRAVENOUS | Status: DC | PRN
  Administered 2022-12-23: 500 mg via INTRAVENOUS

## 2022-12-23 MED ORDER — OXYCODONE-ACETAMINOPHEN 5-325 MG PO TABS: 1.0000 | ORAL_TABLET | ORAL | Status: DC | PRN

## 2022-12-23 MED ORDER — NALOXONE HCL 4 MG/10ML IJ SOLN: 1.0000 ug/kg/h | INTRAVENOUS | Status: DC | PRN

## 2022-12-23 MED ORDER — FENTANYL CITRATE (PF) 100 MCG/2ML IJ SOLN: 25.0000 ug | INTRAMUSCULAR | Status: DC | PRN

## 2022-12-23 MED ORDER — IBUPROFEN 600 MG PO TABS
600.0000 mg | ORAL_TABLET | Freq: Four times a day (QID) | ORAL | Status: DC
  Administered 2022-12-23 – 2022-12-24 (×2): 600 mg via ORAL
  Filled 2022-12-23 (×2): qty 1

## 2022-12-23 MED ORDER — DROPERIDOL 2.5 MG/ML IJ SOLN
INTRAMUSCULAR | Status: AC
  Filled 2022-12-23: qty 2

## 2022-12-23 MED ORDER — DIPHENHYDRAMINE HCL 25 MG PO CAPS: 25.0000 mg | ORAL_CAPSULE | Freq: Four times a day (QID) | ORAL | Status: DC | PRN

## 2022-12-23 MED ORDER — LIDOCAINE 5 % EX PTCH
MEDICATED_PATCH | CUTANEOUS | Status: AC
  Filled 2022-12-23: qty 1

## 2022-12-23 MED ORDER — KETOROLAC TROMETHAMINE 30 MG/ML IJ SOLN
INTRAMUSCULAR | Status: DC | PRN
  Administered 2022-12-23: 30 mg via INTRAVENOUS

## 2022-12-23 MED ORDER — LIDOCAINE HCL (PF) 2 % IJ SOLN
INTRAMUSCULAR | Status: DC | PRN
  Administered 2022-12-23 (×3): 100 mg via EPIDURAL

## 2022-12-23 MED ORDER — MEPERIDINE HCL 25 MG/ML IJ SOLN: 6.2500 mg | INTRAMUSCULAR | Status: DC | PRN

## 2022-12-23 MED ORDER — SODIUM CHLORIDE 0.9 % IV SOLN
INTRAVENOUS | Status: AC
  Filled 2022-12-23: qty 5

## 2022-12-23 MED ORDER — PHENYLEPHRINE 80 MCG/ML (10ML) SYRINGE FOR IV PUSH (FOR BLOOD PRESSURE SUPPORT)
PREFILLED_SYRINGE | INTRAVENOUS | Status: AC
  Filled 2022-12-23: qty 10

## 2022-12-23 MED ORDER — ONDANSETRON HCL 4 MG/2ML IJ SOLN
4.0000 mg | Freq: Three times a day (TID) | INTRAMUSCULAR | Status: DC | PRN
  Administered 2022-12-23 – 2022-12-24 (×2): 4 mg via INTRAVENOUS
  Filled 2022-12-23 (×2): qty 2

## 2022-12-23 MED ORDER — DIPHENHYDRAMINE HCL 50 MG/ML IJ SOLN: 12.5000 mg | INTRAMUSCULAR | Status: DC | PRN

## 2022-12-23 MED ORDER — PRENATAL MULTIVITAMIN CH
1.0000 | ORAL_TABLET | Freq: Every day | ORAL | Status: DC
  Administered 2022-12-23 – 2022-12-25 (×3): 1 via ORAL
  Filled 2022-12-23 (×3): qty 1

## 2022-12-23 MED ORDER — ACETAMINOPHEN 325 MG PO TABS
650.0000 mg | ORAL_TABLET | Freq: Four times a day (QID) | ORAL | Status: AC
  Administered 2022-12-23 (×2): 650 mg via ORAL
  Filled 2022-12-23 (×3): qty 2

## 2022-12-23 MED ORDER — SODIUM CHLORIDE 0.9% FLUSH: 3.0000 mL | INTRAVENOUS | Status: DC | PRN

## 2022-12-23 MED ORDER — MORPHINE SULFATE (PF) 0.5 MG/ML IJ SOLN
INTRAMUSCULAR | Status: DC | PRN
  Administered 2022-12-23: 3 mg via EPIDURAL

## 2022-12-23 MED ORDER — SOD CITRATE-CITRIC ACID 500-334 MG/5ML PO SOLN
ORAL | Status: AC
  Administered 2022-12-23: 30 mL
  Filled 2022-12-23: qty 15

## 2022-12-23 MED ORDER — KETOROLAC TROMETHAMINE 30 MG/ML IJ SOLN
30.0000 mg | Freq: Four times a day (QID) | INTRAMUSCULAR | Status: AC
  Filled 2022-12-23: qty 1

## 2022-12-23 MED ORDER — KETOROLAC TROMETHAMINE 30 MG/ML IJ SOLN
30.0000 mg | Freq: Four times a day (QID) | INTRAMUSCULAR | Status: AC
  Administered 2022-12-23: 30 mg via INTRAVENOUS

## 2022-12-23 MED ORDER — NIFEDIPINE ER OSMOTIC RELEASE 30 MG PO TB24
30.0000 mg | ORAL_TABLET | Freq: Every day | ORAL | Status: AC
  Administered 2022-12-23 – 2022-12-25 (×3): 30 mg via ORAL
  Filled 2022-12-23 (×3): qty 1

## 2022-12-23 MED ORDER — KETOROLAC TROMETHAMINE 30 MG/ML IJ SOLN
INTRAMUSCULAR | Status: AC
  Filled 2022-12-23: qty 1

## 2022-12-23 MED ORDER — DIPHENHYDRAMINE HCL 25 MG PO CAPS: 25.0000 mg | ORAL_CAPSULE | ORAL | Status: DC | PRN

## 2022-12-23 MED ORDER — NALOXONE HCL 0.4 MG/ML IJ SOLN: 0.4000 mg | INTRAMUSCULAR | Status: DC | PRN

## 2022-12-23 MED ORDER — CEFAZOLIN SODIUM-DEXTROSE 2-4 GM/100ML-% IV SOLN
INTRAVENOUS | Status: AC
  Filled 2022-12-23: qty 100

## 2022-12-23 MED ORDER — SIMETHICONE 80 MG PO CHEW
80.0000 mg | CHEWABLE_TABLET | Freq: Four times a day (QID) | ORAL | Status: DC
Start: 2022-12-23 — End: 2022-12-25
  Administered 2022-12-23 – 2022-12-25 (×7): 80 mg via ORAL
  Filled 2022-12-23 (×7): qty 1

## 2022-12-23 MED ORDER — OXYCODONE HCL 5 MG PO TABS
5.0000 mg | ORAL_TABLET | ORAL | Status: AC | PRN
Start: 2022-12-23 — End: 2022-12-24

## 2022-12-23 MED ORDER — CEFAZOLIN SODIUM-DEXTROSE 2-3 GM-%(50ML) IV SOLR
INTRAVENOUS | Status: DC | PRN
  Administered 2022-12-23: 2 g via INTRAVENOUS

## 2022-12-23 MED ORDER — LIDOCAINE 5 % EX PTCH
MEDICATED_PATCH | CUTANEOUS | Status: DC | PRN
  Administered 2022-12-23: 1 via TRANSDERMAL

## 2022-12-23 MED ORDER — FENTANYL CITRATE (PF) 100 MCG/2ML IJ SOLN
INTRAMUSCULAR | Status: DC | PRN
  Administered 2022-12-23: 50 ug via INTRAVENOUS

## 2022-12-23 MED ORDER — FENTANYL CITRATE (PF) 100 MCG/2ML IJ SOLN
INTRAMUSCULAR | Status: AC
  Filled 2022-12-23: qty 2

## 2022-12-23 MED ORDER — PHENYLEPHRINE HCL-NACL 20-0.9 MG/250ML-% IV SOLN
INTRAVENOUS | Status: AC
  Filled 2022-12-23: qty 250

## 2022-12-23 MED ORDER — SUCCINYLCHOLINE CHLORIDE 200 MG/10ML IV SOSY
PREFILLED_SYRINGE | INTRAVENOUS | Status: AC
  Filled 2022-12-23: qty 10

## 2022-12-23 MED ORDER — LIDOCAINE HCL (PF) 2 % IJ SOLN
INTRAMUSCULAR | Status: AC
  Filled 2022-12-23: qty 15

## 2022-12-23 MED ORDER — MENTHOL 3 MG MT LOZG: 1.0000 | LOZENGE | OROMUCOSAL | Status: DC | PRN

## 2022-12-23 MED ORDER — SENNOSIDES-DOCUSATE SODIUM 8.6-50 MG PO TABS
2.0000 | ORAL_TABLET | ORAL | Status: DC
  Administered 2022-12-23 – 2022-12-24 (×2): 2 via ORAL
  Filled 2022-12-23 (×3): qty 2

## 2022-12-23 MED ORDER — MORPHINE SULFATE (PF) 0.5 MG/ML IJ SOLN
INTRAMUSCULAR | Status: AC
  Filled 2022-12-23: qty 10

## 2022-12-23 MED ORDER — FENTANYL CITRATE (PF) 100 MCG/2ML IJ SOLN
INTRAMUSCULAR | Status: DC | PRN
  Administered 2022-12-23: 100 ug via EPIDURAL

## 2022-12-23 MED ORDER — DROPERIDOL 2.5 MG/ML IJ SOLN
INTRAMUSCULAR | Status: DC | PRN
  Administered 2022-12-23: 1.25 mg via INTRAVENOUS

## 2022-12-23 MED ORDER — ONDANSETRON HCL 4 MG/2ML IJ SOLN
INTRAMUSCULAR | Status: DC | PRN
  Administered 2022-12-23: 4 mg via INTRAVENOUS

## 2022-12-23 MED ORDER — OXYTOCIN-SODIUM CHLORIDE 30-0.9 UT/500ML-% IV SOLN
2.5000 [IU]/h | INTRAVENOUS | Status: AC
Start: 2022-12-23 — End: 2022-12-23

## 2022-12-23 SURGICAL SUPPLY — 27 items
ADHESIVE MASTISOL STRL (MISCELLANEOUS) ×1 IMPLANT
BAG COUNTER SPONGE SURGICOUNT (BAG) ×1 IMPLANT
BENZOIN TINCTURE PRP APPL 2/3 (GAUZE/BANDAGES/DRESSINGS) IMPLANT
BNDG TENSOPLAST 6X5 (GAUZE/BANDAGES/DRESSINGS) IMPLANT
CHLORAPREP W/TINT 26 (MISCELLANEOUS) ×2 IMPLANT
DRSG TELFA 3X8 NADH STRL (GAUZE/BANDAGES/DRESSINGS) ×1 IMPLANT
GAUZE SPONGE 4X4 12PLY STRL (GAUZE/BANDAGES/DRESSINGS) ×1 IMPLANT
GLOVE PI ORTHO PRO STRL 7.5 (GLOVE) ×1 IMPLANT
GOWN STRL REUS W/ TWL LRG LVL3 (GOWN DISPOSABLE) ×2 IMPLANT
KIT TURNOVER KIT A (KITS) ×1 IMPLANT
MANIFOLD NEPTUNE II (INSTRUMENTS) ×1 IMPLANT
MAT PREVALON FULL STRYKER (MISCELLANEOUS) ×1 IMPLANT
NS IRRIG 1000ML POUR BTL (IV SOLUTION) ×1 IMPLANT
PACK C SECTION AR (MISCELLANEOUS) ×1 IMPLANT
PAD OB MATERNITY 4.3X12.25 (PERSONAL CARE ITEMS) ×1 IMPLANT
PAD PREP OB/GYN DISP 24X41 (PERSONAL CARE ITEMS) ×1 IMPLANT
RETRACTOR TRAXI PANNICULUS (MISCELLANEOUS) IMPLANT
RETRACTOR WND ALEXIS-O 25 LRG (MISCELLANEOUS) ×1 IMPLANT
RTRCTR WOUND ALEXIS O 25CM LRG (MISCELLANEOUS) ×1
SCRUB CHG 4% DYNA-HEX 4OZ (MISCELLANEOUS) ×1 IMPLANT
SPONGE T-LAP 18X18 ~~LOC~~+RFID (SPONGE) ×1 IMPLANT
STRIP CLOSURE SKIN 1/4X3 (GAUZE/BANDAGES/DRESSINGS) IMPLANT
SUT VIC AB 0 CTX36XBRD ANBCTRL (SUTURE) ×2 IMPLANT
SUT VIC AB 1 CT1 36 (SUTURE) ×2 IMPLANT
SUT VICRYL+ 3-0 36IN CT-1 (SUTURE) ×2 IMPLANT
TRAP FLUID SMOKE EVACUATOR (MISCELLANEOUS) ×1 IMPLANT
WATER STERILE IRR 500ML POUR (IV SOLUTION) ×1 IMPLANT

## 2022-12-23 NOTE — Anesthesia Procedure Notes (Signed)
Epidural Patient location during procedure: OB Start time: 12/23/2022 12:26 AM End time: 12/23/2022 12:38 AM  Staffing Anesthesiologist: Shardai Star, Cleda Mccreedy, MD Performed: anesthesiologist   Preanesthetic Checklist Completed: patient identified, IV checked, site marked, risks and benefits discussed, surgical consent, monitors and equipment checked, pre-op evaluation and timeout performed  Epidural Patient position: sitting Prep: ChloraPrep Patient monitoring: heart rate, continuous pulse ox and blood pressure Approach: midline Location: L2-L3 Injection technique: LOR saline  Needle:  Needle type: Tuohy  Needle gauge: 17 G Needle length: 9 cm and 9 Needle insertion depth: 6 cm Catheter type: closed end flexible Catheter size: 19 Gauge Catheter at skin depth: 10 cm Test dose: negative and 1.5% lidocaine with Epi 1:200 K  Assessment Sensory level: T10 Events: blood not aspirated, no cerebrospinal fluid, injection not painful, no injection resistance, no paresthesia and negative IV test  Additional Notes 1 attempt Pt. Evaluated and documentation done after procedure finished. Patient identified. Risks/Benefits/Options discussed with patient including but not limited to bleeding, infection, nerve damage, paralysis, failed block, incomplete pain control, headache, blood pressure changes, nausea, vomiting, reactions to medication both or allergic, itching and postpartum back pain. Confirmed with bedside nurse the patient's most recent platelet count. Confirmed with patient that they are not currently taking any anticoagulation, have any bleeding history or any family history of bleeding disorders. Patient expressed understanding and wished to proceed. All questions were answered. Sterile technique was used throughout the entire procedure. Please see nursing notes for vital signs. Test dose was given through epidural catheter and negative prior to continuing to dose epidural or start  infusion. Warning signs of high block given to the patient including shortness of breath, tingling/numbness in hands, complete motor block, or any concerning symptoms with instructions to call for help. Patient was given instructions on fall risk and not to get out of bed. All questions and concerns addressed with instructions to call with any issues or inadequate analgesia.    Patient tolerated the insertion well without immediate complications.Reason for block:procedure for pain

## 2022-12-23 NOTE — Op Note (Signed)
     OP NOTE  Date: 12/23/2022   3:18 AM Name Kim Maxwell MR# [id number]  Preoperative Diagnosis: 1. Intrauterine pregnancy at [redacted]w[redacted]d Principal Problem:   Gestational hypertension  2.  Arrest of dilation during active phase  Postoperative Diagnosis: 1. Intrauterine pregnancy at [redacted]w[redacted]d, delivered 2. Viable infant 3. Remainder same as pre-op   Procedure: 1. Primary Low-Transverse Cesarean Section  Surgeon: Elonda Husky, MD  Assistant:  [name], CNM  Anesthesia: Failed Epidural - General   EBL:     Findings: 1) female infant, Apgar scores of 8   at 1 minute and 9   at 5 minutes and a birthweight of 124.16 ounces.    2) Normal uterus, tubes and ovaries.    Procedure:  The patient was prepped and draped in the supine position and placed under spinal anesthesia.  A transverse incision was made across the abdomen in a Pfannenstiel manner. If indicated the old scar was systematically removed with sharp dissection.  We carried the dissection down to the level of the fascia.  The fascia was incised in a curvilinear manner.  The fascia was then elevated from the rectus muscles with blunt and sharp dissection.  The rectus muscles were separated laterally exposing the peritoneum.  The peritoneum was carefully entered with care being taken to avoid bowel and bladder.  A self-retaining retractor was placed.  The visceral peritoneum was incised in a curvilinear fashion across the lower uterine segment creating a bladder flap. A transverse incision was made across the lower uterine segment and extended laterally and superiorly with blunt dissection.  Artificial rupture membranes was performed and None fluid was noted.  The infant was delivered from the cephalic position.  A nuchal cord was not present. After an appropriate time interval, the cord was doubly clamped and cut. Cord blood was obtained if required.  The infant was handed to the pediatric personnel  who then placed the infant  under heat lamps where it was cleaned dried and suctioned as needed. The placenta was delivered. The hysterotomy incision was then identified on ring forceps.  The uterine cavity was cleaned with a moist lap sponge.  The hysterotomy incision was closed with a running interlocking suture of Vicryl.  Hemostasis was excellent.  Pitocin was run in the IV and the uterus was found to be firm. The posterior cul-de-sac and gutters were cleaned and inspected.  Hemostasis was noted.  The fascia was then closed with a running suture of #1 Vicryl.  Hemostasis of the subcutaneous tissues was obtained using the Bovie.  The subcutaneous tissues were closed with a running suture of 000 Vicryl.  A subcuticular suture was placed.  Steri-strips were applied in the usual manner.  A Lidoderm patch was applied.  A pressure dressing was placed.  The patient went to the recovery room in stable condition. [name], CNM provided exposure, dissection, suctioning, retraction, and general support and assistance during the procedure.   Elonda Husky, M.D. 12/23/2022 3:18 AM

## 2022-12-23 NOTE — Progress Notes (Signed)
LABOR NOTE   Kim Maxwell 27 y.o.GP@ at [redacted]w[redacted]d  SUBJECTIVE:  Pt feeling comfortable with epidural  Analgesia: Epidural  OBJECTIVE:  BP 131/87   Pulse (!) 106   Temp 99.9 F (37.7 C) (Oral)   Resp 20   Ht 4\' 10"  (1.473 m)   Wt 82.5 kg   LMP 02/11/2022 (Exact Date) Comment: Had a little spotting Febuary, reported history of Polycystic Ovarian sydrome  SpO2 96%   BMI 38.02 kg/m  No intake/output data recorded.  She has not shown cervical change. CERVIX: 6 cm:  70% Swelling:   -2:   mid position:   soft SVE:   Dilation: 7 Effacement (%): 70 Station: -2 Exam by:: AThompsonCNM CONTRACTIONS: regular, every 1-3 minutes FHR: Fetal heart tracing reviewed. Baseline: 145 bpm, Variability: Good {> 6 bpm), Accelerations: Reactive, and Decelerations: variable  Category II    Labs: Lab Results  Component Value Date   WBC 8.7 12/21/2022   HGB 12.2 12/21/2022   HCT 36.1 12/21/2022   MCV 81.7 12/21/2022   PLT 289 12/21/2022    ASSESSMENT: 1) Labor curve reviewed.       Progress: Adequate uterine activity - intensity and frequency., Prolonged active phase labor., and Arrest of progress - first stage labor.     Membranes: ruptured, clear fluid           Principal Problem:   Gestational hypertension     Overview: -At 34 weeks, 2 MRBPs in clinic, sent to L&D for PIH work up, PIH labs      WNL, blood pressures stable in triage     - at [redacted]w[redacted]d, MRBP at [facility], now meets criteria for gHTN.    PLAN: No cervical change,cervix beginning to swell, Pitocin on maximum dose and pt temperature beginning to rise.Discussed with pt that she has been on the maximum dose of pitocin x 2 . 5 hours and has not made any change. Recommend primary cesarean section. Patient and her partner agree. Reviewed risk and benefit of surgery. Consent signed. Dr. Logan Bores notified. Anesthesia notified. Pitocin turned off.    [name], CNM  12/23/2022 1:55 AM

## 2022-12-23 NOTE — Anesthesia Procedure Notes (Signed)
Procedure Name: Intubation Date/Time: 12/23/2022 2:49 AM  Performed by: Elmarie Mainland, CRNAPre-anesthesia Checklist: Patient identified, Emergency Drugs available and Suction available Patient Re-evaluated:Patient Re-evaluated prior to induction Oxygen Delivery Method: Circle system utilized Preoxygenation: Pre-oxygenation with 100% oxygen Induction Type: IV induction, Rapid sequence and Cricoid Pressure applied Laryngoscope Size: McGrath and 3 Grade View: Grade I Tube type: Oral Tube size: 6.0 mm Number of attempts: 1 Airway Equipment and Method: Rigid stylet and Video-laryngoscopy Placement Confirmation: ETT inserted through vocal cords under direct vision, positive ETCO2 and breath sounds checked- equal and bilateral Secured at: 21 cm Tube secured with: Tape Dental Injury: Teeth and Oropharynx as per pre-operative assessment

## 2022-12-23 NOTE — Anesthesia Procedure Notes (Signed)
Date/Time: 12/23/2022 2:35 AM  Performed by: Elmarie Mainland, CRNAPre-anesthesia Checklist: Patient identified, Emergency Drugs available, Suction available and Patient being monitored Patient Re-evaluated:Patient Re-evaluated prior to induction Oxygen Delivery Method: Nasal cannula

## 2022-12-23 NOTE — Transfer of Care (Signed)
Immediate Anesthesia Transfer of Care Note  Patient: Kim Maxwell  Procedure(s) Performed: CESAREAN SECTION  Patient Location: PACU and Mother/Baby  Anesthesia Type:General and Epidural  Level of Consciousness: awake, alert , and oriented  Airway & Oxygen Therapy: Patient Spontanous Breathing  Post-op Assessment: Report given to RN and Post -op Vital signs reviewed and stable  Post vital signs: Reviewed and stable  Last Vitals:  Vitals Value Taken Time  BP 152/96 12/23/22 0342  Temp    Pulse 122 12/23/22 0345  Resp 18 12/23/22 0345  SpO2 97 % 12/23/22 0345  Vitals shown include unfiled device data.  Last Pain:  Vitals:   12/23/22 0145  TempSrc: Oral  PainSc:       Patients Stated Pain Goal: 0 (12/22/22 0730)  Complications: No notable events documented.

## 2022-12-23 NOTE — Lactation Note (Signed)
This note was copied from a baby's chart. Lactation Consultation Note  Patient Name: Kim Maxwell [id number] Date: 12/23/2022 Age:25 hours Reason for consult: Initial assessment;Primapara;Mother's request;Early term 45-38.6wks   Maternal Data Has patient been taught Hand Expression?: Yes Does the patient have breastfeeding experience prior to this delivery?: No  Initial assessment w/ a P1 patient and 11hr old baby Kim.  This was a c-section delivery.  Feeding goal is breastfeeding.  Patient w/ hx of gestation hypertension and PCOS but controlled w/ diet per patient.   Patient stated that she has Motiff breastpump.  Feeding Mother's Current Feeding Choice: Breast Milk  1534: LC present for an attempted feeding at the breast.  After several attempts infant did not eat.  LC put infant STS suggested patient try again in 15-30 minutes.  Patient hand expressing as well and feeding back any drops of milk to infant.  Interventions Interventions: Breast feeding basics reviewed;Skin to skin;Education  LC provided education on the following;  milk production expectations, hunger cues, day 1/2 wet/dirty diapers, hand expression, cluster feeding, benefits of STS and arousing infant for a feeding.  Lactation informed patient of feeding infant at least 8 or more times w/in a 24hr period but not exceeding 3hrs. Patient verbalized understanding.     Discharge Pump: Personal Chief Strategy Officer; [place]) WIC Program: Yes  Consult Status Consult Status: Follow-up Date: 12/24/22 Follow-up type: In-patient    Kim Maxwell 12/23/2022, 4:23 PM

## 2022-12-24 ENCOUNTER — Encounter: Payer: Self-pay | Admitting: Obstetrics and Gynecology

## 2022-12-24 MED ORDER — ONDANSETRON 4 MG PO TBDP
4.0000 mg | ORAL_TABLET | Freq: Four times a day (QID) | ORAL | Status: DC | PRN
  Administered 2022-12-24: 4 mg via ORAL
  Filled 2022-12-24 (×2): qty 1

## 2022-12-24 MED ORDER — IBUPROFEN 600 MG PO TABS
600.0000 mg | ORAL_TABLET | Freq: Four times a day (QID) | ORAL | Status: DC
Start: 2022-12-24 — End: 2022-12-25
  Administered 2022-12-24 – 2022-12-25 (×6): 600 mg via ORAL
  Filled 2022-12-24 (×6): qty 1

## 2022-12-24 MED ORDER — POLYETHYLENE GLYCOL 3350 17 G PO PACK
17.0000 g | PACK | Freq: Once | ORAL | Status: AC
  Administered 2022-12-24: 17 g via ORAL
  Filled 2022-12-24: qty 1

## 2022-12-24 NOTE — Anesthesia Postprocedure Evaluation (Signed)
Anesthesia Post Note  Patient: Kim Maxwell  Procedure(s) Performed: CESAREAN SECTION  Patient location during evaluation: Mother Baby Anesthesia Type: General Level of consciousness: awake and alert and oriented Pain management: pain level controlled Vital Signs Assessment: post-procedure vital signs reviewed and stable Respiratory status: nonlabored ventilation and respiratory function stable Cardiovascular status: stable Postop Assessment: no headache, no backache, no apparent nausea or vomiting, adequate PO intake and able to ambulate Anesthetic complications: no   No notable events documented.   Last Vitals:  Vitals:   12/23/22 2317 12/24/22 0316  BP: 121/88 121/77  Pulse: 100 (!) 104  Resp: 18 20  Temp: 36.9 C 36.9 C  SpO2: 100% 100%    Last Pain:  Vitals:   12/24/22 0316  TempSrc: Oral  PainSc:                  Katherine Basset

## 2022-12-24 NOTE — Progress Notes (Signed)
Progress Note - Cesarean Delivery  Kim Maxwell is a 27 y.o. G1P1001 now PP day 1 s/p C-Section, Low Transverse.   Subjective:  Patient reports that she feels constipated.  She is passing gas.  She has been out of bed.  She is breast-feeding.  MiraLAX this morning but not productive yet. (General anesthesia) She reports her pain as manageable without narcotics.    Objective:  Vital signs in last 24 hours: Temp:  [98.2 F (36.8 C)-98.5 F (36.9 C)] 98.4 F (36.9 C) (12/05 1134) Pulse Rate:  [100-130] 120 (12/05 1134) Resp:  [17-20] 18 (12/05 1134) BP: (121-139)/(77-102) 136/102 (12/05 1134) SpO2:  [99 %-100 %] 99 % (12/05 1008)  Physical Exam:  General: alert, cooperative, and no distress Lochia: appropriate Uterine Fundus: firm Incision: Dressing intact    Data Review Recent Labs    12/21/22 1814 12/23/22 0652  HGB 12.2 9.3*  HCT 36.1 27.3*    Assessment:  Principal Problem:   Gestational hypertension     Overview: -At 34 weeks, 2 MRBPs in clinic, sent to L&D for PIH work up, PIH labs      WNL, blood pressures stable in triage     - at [redacted]w[redacted]d, MRBP at [facility], now meets criteria for gHTN.  Active Problems:   Labor and delivery, indication for care   Status post Cesarean section. Doing well postoperatively.   Some constipation and mild ileus based on symptoms.  Patient received MiraLAX and expect that to be productive.  Most likely this is a result of general anesthesia and will quickly resolved.  Plan:       Continue current care.  Out of bed encouraged  May shower.  Dressing change today.  Elonda Husky, M.D. 12/24/2022 1:42 PM

## 2022-12-24 NOTE — Lactation Note (Signed)
This note was copied from a baby's chart. Lactation Consultation Note  Patient Name: Kim Maxwell [id number] Date: 12/24/2022 Age:25 hours Reason for consult: Follow-up assessment;Primapara;Early term 37-38.6wks;Breastfeeding assistance;RN request   Maternal Data This is mom's 1st baby, C/S for arrest of descent. Mom with history of PCOS.  On follow-up today mom reports baby prefers her left side however she has been able to latch baby to the right side at this feed. Mom earlier this am was having gas pain and uncomfortable. Mom reports she is feeling better. She has modified her breastfeeding hold to what feels comfortable for mom when sitting in an upright chair.  LC number on white board if mom would like LC breastfeeding assistance at next feed. Has patient been taught Hand Expression?: Yes Does the patient have breastfeeding experience prior to this delivery?: No  Feeding Mother's Current Feeding Choice: Breast Milk Observed baby breastfeeding. Provided mom with tips and strategies to maximize position and latch technique. LATCH Score Latch: Grasps breast easily, tongue down, lips flanged, rhythmical sucking. (Mom reports she has been able to identify when she sees baby open his mouth a certain way that prompts her to quickly brings him on to the breast.)  Audible Swallowing: Spontaneous and intermittent  Type of Nipple: Everted at rest and after stimulation  Comfort (Breast/Nipple): Soft / non-tender  Hold (Positioning): Assistance needed to correctly position infant at breast and maintain latch. (Due to mom's discomfort from gas and needing to have BM mom is sitting upright inchair and has modified positioning of baby at the breast  on her abdomen as this is most comfortable for mom.Pecola Leisure is latching and feeding.)  LATCH Score: 9   Interventions Interventions: Breast feeding basics reviewed;Hand express;Position options;Education  Discharge Pump: Personal  Consult  Status Consult Status: Follow-up Date: 12/25/22 Follow-up type: In-patient  Update provided to care nurse.  [name] 12/24/2022, 1:06 PM

## 2022-12-24 NOTE — Lactation Note (Signed)
This note was copied from a baby's chart. Lactation Consultation Note  Patient Name: Kim Maxwell [id number] Date: 12/24/2022 Age:25 hours Reason for consult: Follow-up assessment;Mother's request;Early term 37-38.6wks;RN request;Primapara   Maternal Data This is mom's 1st baby, C/S for arrest of descent. Mom with history of PCOS.   On follow-up this evening mom parents were attempting to wake baby to feed. LC provided tips on how to wake a sleepy baby and assisted mom with breastfeeding. Has patient been taught Hand Expression?: Yes Does the patient have breastfeeding experience prior to this delivery?: No  Feeding Mother's Current Feeding Choice: Breast Milk Provided tips and strategies to maximize position and latch technique. Encouraged mom to swiftly bring baby to the breast. Mom was able to latch baby deeply. Multiple audible swallows were noted by parents and LC. LATCH Score Latch: Grasps breast easily, tongue down, lips flanged, rhythmical sucking. (After instruction by LC on optizing latch.)  Audible Swallowing: Spontaneous and intermittent  Type of Nipple: Everted at rest and after stimulation  Comfort (Breast/Nipple): Soft / non-tender  Hold (Positioning): Assistance needed to correctly position infant at breast and maintain latch.  LATCH Score: 9   Interventions Interventions: Breast feeding basics reviewed;Breast massage;Breast compression;Adjust position;Support pillows;Education  Discharge Pump: Personal  Consult Status Consult Status: Follow-up Date: 12/25/22 Follow-up type: In-patient  Update provided to care nurse.  [name] 12/24/2022, 5:49 PM

## 2022-12-25 MED ORDER — OXYCODONE HCL 5 MG PO TABS: 5.0000 mg | ORAL_TABLET | ORAL | 0 refills | Status: DC | PRN

## 2022-12-25 MED ORDER — IBUPROFEN 600 MG PO TABS: 600.0000 mg | ORAL_TABLET | Freq: Four times a day (QID) | ORAL | 1 refills | Status: DC

## 2022-12-25 MED ORDER — NIFEDIPINE ER OSMOTIC RELEASE 30 MG PO TB24: 30.0000 mg | ORAL_TABLET | Freq: Every day | ORAL | 2 refills | Status: DC

## 2022-12-25 MED ORDER — MEASLES, MUMPS & RUBELLA VAC IJ SOLR
0.5000 mL | Freq: Once | INTRAMUSCULAR | Status: AC
  Administered 2022-12-25: 0.5 mL via SUBCUTANEOUS
  Filled 2022-12-25 (×2): qty 0.5

## 2022-12-25 MED ORDER — VARICELLA VIRUS VACCINE LIVE 1350 PFU/0.5ML IJ SUSR
0.5000 mL | INTRAMUSCULAR | Status: AC | PRN
  Administered 2022-12-25: 0.5 mL via SUBCUTANEOUS
  Filled 2022-12-25: qty 0.5

## 2022-12-25 MED ORDER — SIMETHICONE 80 MG PO CHEW: 80.0000 mg | CHEWABLE_TABLET | Freq: Four times a day (QID) | ORAL | 0 refills | Status: DC | PRN

## 2022-12-25 MED ORDER — DOCUSATE SODIUM 100 MG PO CAPS: 100.0000 mg | ORAL_CAPSULE | Freq: Two times a day (BID) | ORAL | 2 refills | Status: DC

## 2022-12-25 MED ORDER — LIDOCAINE 5 % EX PTCH: 1.0000 | MEDICATED_PATCH | Freq: Two times a day (BID) | CUTANEOUS | 1 refills | Status: DC

## 2022-12-25 MED ORDER — ACETAMINOPHEN 325 MG PO TABS: 650.0000 mg | ORAL_TABLET | ORAL | 1 refills | Status: DC | PRN

## 2022-12-25 NOTE — Discharge Instructions (Signed)

## 2022-12-25 NOTE — Discharge Summary (Signed)
Postpartum Discharge Summary   Patient Name: Kim Maxwell DOB: 10-Jan-1996 MRN: [id number]  Date of admission: 12/21/2022 Delivery date:12/23/2022 Delivering provider: Linzie Collin Date of discharge: 12/25/2022  Admitting diagnosis: Gestational hypertension [O13.9] Intrauterine pregnancy: [redacted]w[redacted]d     Secondary diagnosis:  Principal Problem:   Gestational hypertension Active Problems:   Labor and delivery, indication for care  Additional problems: none    Discharge diagnosis: Term Pregnancy Delivered and Gestational Hypertension                                              Post partum procedures: none Augmentation: AROM, Pitocin, Cytotec, and IP Foley Complications: None  Hospital course: Onset of Labor With Unplanned C/S   27 y.o. yo G1P1001 at [redacted]w[redacted]d was admitted for IOL due to [facility] on 12/21/2022. Patient had a labor course significant for arrest of dilation. The patient went for cesarean section due to Arrest of Dilation. Delivery details as follows: Membrane Rupture Time/Date: 4:15 PM,12/22/2022  Delivery Method:C-Section, Low Transverse Operative Delivery:N/A Details of operation can be found in separate operative note. Patient had a postpartum course complicated by anemia, on oral iron.  She is ambulating,tolerating a regular diet, passing flatus, and urinating well.  Patient is discharged home in stable condition 12/25/22.  Newborn Data: Birth date:12/23/2022 Birth time:2:53 AM Gender:Female Living status:Living Apgars:8 ,9  Weight:3520 g  Magnesium Sulfate received: No BMZ received: No Rhophylac:N/A MMR:Yes T-DaP:Given prenatally Flu: Given prenatally RSV Vaccine received: Given prenatally Transfusion:No Immunizations administered: Immunization History  Administered Date(s) Administered   Influenza, Seasonal, Injecte, Preservative Fre 11/02/2022   Janssen (J&J) SARS-COV-2 Vaccination 04/29/2019   Rsv, Bivalent, Protein Subunit Rsvpref,pf Verdis Frederickson)  11/30/2022   Tdap 11/02/2022    Physical exam  Vitals:   12/24/22 2131 12/25/22 0005 12/25/22 0322 12/25/22 0802  BP: (!) 127/91 128/87 (!) 129/91 (!) 130/94  Pulse: (!) 117 (!) 114 (!) 110 95  Resp: 18 20 20 20   Temp: 98.7 F (37.1 C) 98.3 F (36.8 C) 98.4 F (36.9 C) 98 F (36.7 C)  TempSrc: Oral Oral Oral Oral  SpO2: 100% 100% 100% 100%  Weight:      Height:       General: alert, cooperative, and no distress Lochia: appropriate Uterine Fundus: firm Incision: Dressing is clean, dry, and intact DVT Evaluation: No evidence of DVT seen on physical exam. Calf/Ankle edema is present, 2+ pitting Labs: Lab Results  Component Value Date   WBC 15.9 (H) 12/23/2022   HGB 9.3 (L) 12/23/2022   HCT 27.3 (L) 12/23/2022   MCV 82.0 12/23/2022   PLT 215 12/23/2022      Latest Ref Rng & Units 12/21/2022    6:14 PM  CMP  Glucose 70 - 99 mg/dL 782   BUN 6 - 20 mg/dL 9   Creatinine 9.56 - 2.13 mg/dL 0.86   Sodium 578 - 469 mmol/L 136   Potassium 3.5 - 5.1 mmol/L 3.6   Chloride 98 - 111 mmol/L 108   CO2 22 - 32 mmol/L 19   Calcium 8.9 - 10.3 mg/dL 8.3   Total Protein 6.5 - 8.1 g/dL 6.7   Total Bilirubin <6.2 mg/dL 0.7   Alkaline Phos 38 - 126 U/L 238   AST 15 - 41 U/L 19   ALT 0 - 44 U/L 16    Edinburgh Score:  05/14/2022    9:28 AM  Edinburgh Postnatal Depression Scale Screening Tool  I have been able to laugh and see the funny side of things. 0  I have looked forward with enjoyment to things. 0  I have blamed myself unnecessarily when things went wrong. 0  I have been anxious or worried for no good reason. 2  I have felt scared or panicky for no good reason. 0  Things have been getting on top of me. 1  I have been so unhappy that I have had difficulty sleeping. 0  I have felt sad or miserable. 0  I have been so unhappy that I have been crying. 0  The thought of harming myself has occurred to me. 0  Edinburgh Postnatal Depression Scale Total 3      After visit  meds:  Allergies as of 12/25/2022   No Known Allergies      Medication List     STOP taking these medications    aspirin 81 MG chewable tablet       TAKE these medications    acetaminophen 325 MG tablet Commonly known as: TYLENOL Take 2 tablets (650 mg total) by mouth every 4 (four) hours as needed (for pain scale < 4  OR  temperature  >/=  100.5 F).   docusate sodium 100 MG capsule Commonly known as: Colace Take 1 capsule (100 mg total) by mouth 2 (two) times daily.   ibuprofen 600 MG tablet Commonly known as: ADVIL Take 1 tablet (600 mg total) by mouth every 6 (six) hours.   lidocaine 5 % Commonly known as: Lidoderm Place 1 patch onto the skin every 12 (twelve) hours for 14 days. Apply to intact skin above incision site & dressing.   NIFEdipine 30 MG 24 hr tablet Commonly known as: PROCARDIA-XL/NIFEDICAL-XL Take 1 tablet (30 mg total) by mouth daily. Can increase to twice a day as needed for symptomatic contractions   oxyCODONE 5 MG immediate release tablet Commonly known as: Roxicodone Take 1 tablet (5 mg total) by mouth every 4 (four) hours as needed for severe pain (pain score 7-10).   potassium chloride 10 MEQ tablet Commonly known as: KLOR-CON M Take 1 tablet (10 mEq total) by mouth daily for 14 days.   simethicone 80 MG chewable tablet Commonly known as: MYLICON Chew 1 tablet (80 mg total) by mouth 4 (four) times daily as needed for flatulence.         Discharge home in stable condition Infant Feeding: Breast Infant Disposition:home with mother Discharge instruction: per After Visit Summary and Postpartum booklet. Activity: Advance as tolerated. Pelvic rest for 6 weeks.  Diet: routine diet Anticipated Birth Control: Condoms Postpartum Appointment:6 weeks Additional Postpartum F/U: Incision check 1 week and BP check 1 week Future Appointments:No future appointments. Follow up Visit:      12/25/2022 Dominica Severin, CNM

## 2022-12-25 NOTE — Plan of Care (Signed)
  Problem: Education: Goal: Knowledge of disease or condition will improve Outcome: Progressing   Problem: Education: Goal: Knowledge of condition will improve Outcome: Progressing   Problem: Activity: Goal: Will verbalize the importance of balancing activity with adequate rest periods Outcome: Progressing

## 2022-12-25 NOTE — Lactation Note (Signed)
This note was copied from a baby's chart. Lactation Consultation Note  Patient Name: Kim Maxwell [id number] Date: 12/25/2022 Age:25 hours Reason for consult: Follow-up assessment;Primapara;Early term 37-38.6wks  Follow up assessment/discharge education to 54hr old baby Kim and patient. Per patient feeding is going good.  Infant prefers the left breast.  Nipples are tender but that is coming from when she takes him off the breast he pulls at moms nipples.  Patient also stated that her breast do feel heavier today.   Feeding Mother's Current Feeding Choice: Breast Milk  Interventions Interventions: Breast feeding basics reviewed;Education;CDC Guidelines for Breast Pump Cleaning  LC provided patient with hydrogels and taught how to use.   Discharge Discharge Education: Engorgement and breast care;Outpatient recommendation  Education on engorgement prevention/treatment was discussed as well as breastmilk storage guidelines.  LC provided patient with a handout on breastmilk storage guidelines from [facility]. [facility] outpatient lactation services phone number written on the white board in the room.  Patient verbalized understanding.  Consult Status Consult Status: Complete Follow-up type: Call as needed    Kim Maxwell 12/25/2022, 9:51 AM

## 2022-12-29 ENCOUNTER — Emergency Department: Payer: Medicaid Other

## 2022-12-29 ENCOUNTER — Other Ambulatory Visit: Payer: Self-pay

## 2022-12-29 ENCOUNTER — Inpatient Hospital Stay
Admission: EM | Admit: 2022-12-29 | Discharge: 2022-12-31 | DRG: 776 | Disposition: A | Discharge status: Home / Self Care | Payer: Medicaid Other | Attending: Obstetrics | Admitting: Obstetrics

## 2022-12-29 ENCOUNTER — Inpatient Hospital Stay: Payer: Medicaid Other

## 2022-12-29 DIAGNOSIS — R030 Elevated blood-pressure reading, without diagnosis of hypertension: Secondary | ICD-10-CM | POA: Diagnosis present

## 2022-12-29 DIAGNOSIS — O9081 Anemia of the puerperium: Secondary | ICD-10-CM | POA: Diagnosis present

## 2022-12-29 DIAGNOSIS — D649 Anemia, unspecified: Secondary | ICD-10-CM

## 2022-12-29 DIAGNOSIS — O1415 Severe pre-eclampsia, complicating the puerperium: Secondary | ICD-10-CM | POA: Diagnosis present

## 2022-12-29 DIAGNOSIS — R42 Dizziness and giddiness: Secondary | ICD-10-CM

## 2022-12-29 DIAGNOSIS — M549 Dorsalgia, unspecified: Principal | ICD-10-CM

## 2022-12-29 DIAGNOSIS — O1495 Unspecified pre-eclampsia, complicating the puerperium: Secondary | ICD-10-CM | POA: Insufficient documentation

## 2022-12-29 LAB — CBC WITH DIFFERENTIAL/PLATELET
Abs Immature Granulocytes: 0.04 10*3/uL (ref 0.00–0.07)
Basophils Absolute: 0 10*3/uL (ref 0.0–0.1)
Basophils Relative: 0 %
Eosinophils Absolute: 0.1 10*3/uL (ref 0.0–0.5)
Eosinophils Relative: 2 %
HCT: 23.4 % — ABNORMAL LOW (ref 36.0–46.0)
Hemoglobin: 7.6 g/dL — ABNORMAL LOW (ref 12.0–15.0)
Immature Granulocytes: 1 %
Lymphocytes Relative: 31 %
Lymphs Abs: 2 10*3/uL (ref 0.7–4.0)
MCH: 28.4 pg (ref 26.0–34.0)
MCHC: 32.5 g/dL (ref 30.0–36.0)
MCV: 87.3 fL (ref 80.0–100.0)
Monocytes Absolute: 0.4 10*3/uL (ref 0.1–1.0)
Monocytes Relative: 6 %
Neutro Abs: 4 10*3/uL (ref 1.7–7.7)
Neutrophils Relative %: 60 %
Platelets: 366 10*3/uL (ref 150–400)
RBC: 2.68 MIL/uL — ABNORMAL LOW (ref 3.87–5.11)
RDW: 14.4 % (ref 11.5–15.5)
WBC: 6.6 10*3/uL (ref 4.0–10.5)
nRBC: 0 % (ref 0.0–0.2)

## 2022-12-29 LAB — URINALYSIS, ROUTINE W REFLEX MICROSCOPIC
Bilirubin Urine: NEGATIVE
Glucose, UA: NEGATIVE mg/dL
Ketones, ur: NEGATIVE mg/dL
Leukocytes,Ua: NEGATIVE
Nitrite: NEGATIVE
Protein, ur: NEGATIVE mg/dL
Specific Gravity, Urine: 1.021 (ref 1.005–1.030)
pH: 6 (ref 5.0–8.0)

## 2022-12-29 LAB — COMPREHENSIVE METABOLIC PANEL
ALT: 35 U/L (ref 0–44)
AST: 30 U/L (ref 15–41)
Albumin: 2.9 g/dL — ABNORMAL LOW (ref 3.5–5.0)
Alkaline Phosphatase: 144 U/L — ABNORMAL HIGH (ref 38–126)
Anion gap: 7 (ref 5–15)
BUN: 15 mg/dL (ref 6–20)
CO2: 21 mmol/L — ABNORMAL LOW (ref 22–32)
Calcium: 8.2 mg/dL — ABNORMAL LOW (ref 8.9–10.3)
Chloride: 110 mmol/L (ref 98–111)
Creatinine, Ser: 0.59 mg/dL (ref 0.44–1.00)
GFR, Estimated: >60 mL/min (ref >60)
Glucose, Bld: 91 mg/dL (ref 70–99)
Potassium: 3.9 mmol/L (ref 3.5–5.1)
Sodium: 138 mmol/L (ref 135–145)
Total Bilirubin: 0.6 mg/dL (ref <1.2)
Total Protein: 6 g/dL — ABNORMAL LOW (ref 6.5–8.1)

## 2022-12-29 LAB — PROTEIN / CREATININE RATIO, URINE
Creatinine, Urine: 47 mg/dL
Total Protein, Urine: <6 mg/dL

## 2022-12-29 LAB — MAGNESIUM: Magnesium: 1.8 mg/dL (ref 1.7–2.4)

## 2022-12-29 LAB — LACTATE DEHYDROGENASE: LDH: 176 U/L (ref 98–192)

## 2022-12-29 MED ORDER — METHYLPREDNISOLONE SODIUM SUCC 125 MG IJ SOLR
125.0000 mg | Freq: Once | INTRAMUSCULAR | Status: DC | PRN
Start: 2022-12-29 — End: 2022-12-31

## 2022-12-29 MED ORDER — ONDANSETRON HCL 4 MG/2ML IJ SOLN
4.0000 mg | Freq: Once | INTRAMUSCULAR | Status: AC
  Administered 2022-12-29: 4 mg via INTRAVENOUS
  Filled 2022-12-29: qty 2

## 2022-12-29 MED ORDER — ACETAMINOPHEN 500 MG PO TABS
1000.0000 mg | ORAL_TABLET | Freq: Once | ORAL | Status: AC
  Administered 2022-12-29: 1000 mg via ORAL
  Filled 2022-12-29: qty 2

## 2022-12-29 MED ORDER — MAGNESIUM SULFATE 40 GM/1000ML IV SOLN
2.0000 g/h | INTRAVENOUS | Status: DC
  Administered 2022-12-29 – 2022-12-30 (×2): 2 g/h via INTRAVENOUS
  Filled 2022-12-29: qty 1000

## 2022-12-29 MED ORDER — ALBUTEROL SULFATE (2.5 MG/3ML) 0.083% IN NEBU
2.5000 mg | INHALATION_SOLUTION | Freq: Once | RESPIRATORY_TRACT | Status: DC | PRN
Start: 2022-12-29 — End: 2022-12-31

## 2022-12-29 MED ORDER — DIPHENHYDRAMINE HCL 50 MG/ML IJ SOLN
25.0000 mg | Freq: Once | INTRAMUSCULAR | Status: DC | PRN
Start: 2022-12-29 — End: 2022-12-31

## 2022-12-29 MED ORDER — LABETALOL HCL 5 MG/ML IV SOLN
10.0000 mg | Freq: Once | INTRAVENOUS | Status: DC
  Filled 2022-12-29: qty 4

## 2022-12-29 MED ORDER — SODIUM CHLORIDE 0.9 % IV BOLUS
500.0000 mL | Freq: Once | INTRAVENOUS | Status: DC | PRN
Start: 2022-12-29 — End: 2022-12-31

## 2022-12-29 MED ORDER — SODIUM CHLORIDE 0.9 % IV SOLN: 25.0000 mg | Freq: Four times a day (QID) | INTRAVENOUS | Status: DC | PRN

## 2022-12-29 MED ORDER — MAGNESIUM SULFATE BOLUS VIA INFUSION
4.0000 g | Freq: Once | INTRAVENOUS | Status: AC
  Administered 2022-12-29: 4 g via INTRAVENOUS
  Filled 2022-12-29: qty 1000

## 2022-12-29 MED ORDER — PROMETHAZINE HCL 25 MG PO TABS
12.5000 mg | ORAL_TABLET | Freq: Four times a day (QID) | ORAL | Status: DC | PRN
Start: 2022-12-29 — End: 2022-12-31

## 2022-12-29 MED ORDER — IBUPROFEN 600 MG PO TABS
600.0000 mg | ORAL_TABLET | Freq: Four times a day (QID) | ORAL | Status: DC | PRN
  Administered 2022-12-30 (×2): 600 mg via ORAL
  Filled 2022-12-29 (×2): qty 1

## 2022-12-29 MED ORDER — LABETALOL HCL 5 MG/ML IV SOLN
20.0000 mg | INTRAVENOUS | Status: DC | PRN
  Administered 2022-12-29: 20 mg via INTRAVENOUS
  Filled 2022-12-29 (×2): qty 4

## 2022-12-29 MED ORDER — IRON SUCROSE 300 MG IVPB - SIMPLE MED
300.0000 mg | Freq: Once | Status: AC
  Administered 2022-12-29: 300 mg via INTRAVENOUS
  Filled 2022-12-29: qty 300

## 2022-12-29 MED ORDER — ACETAMINOPHEN 500 MG PO TABS
1000.0000 mg | ORAL_TABLET | Freq: Four times a day (QID) | ORAL | Status: DC | PRN
  Administered 2022-12-30: 1000 mg via ORAL
  Filled 2022-12-29: qty 2

## 2022-12-29 MED ORDER — IBUPROFEN 600 MG PO TABS
600.0000 mg | ORAL_TABLET | Freq: Once | ORAL | Status: DC
  Filled 2022-12-29: qty 1

## 2022-12-29 MED ORDER — CALCIUM GLUCONATE 10 % IV SOLN
INTRAVENOUS | Status: AC
  Filled 2022-12-29: qty 10

## 2022-12-29 MED ORDER — LABETALOL HCL 5 MG/ML IV SOLN: 40.0000 mg | INTRAVENOUS | Status: DC | PRN

## 2022-12-29 MED ORDER — EPINEPHRINE 0.3 MG/0.3ML IJ SOAJ
0.3000 mg | Freq: Once | INTRAMUSCULAR | Status: DC | PRN
Start: 2022-12-29 — End: 2022-12-31

## 2022-12-29 MED ORDER — FUROSEMIDE 20 MG PO TABS
20.0000 mg | ORAL_TABLET | Freq: Once | ORAL | Status: AC
  Administered 2022-12-29: 20 mg via ORAL
  Filled 2022-12-29: qty 1

## 2022-12-29 MED ORDER — NIFEDIPINE ER OSMOTIC RELEASE 30 MG PO TB24
30.0000 mg | ORAL_TABLET | Freq: Every day | ORAL | Status: DC
  Administered 2022-12-29 – 2022-12-31 (×3): 30 mg via ORAL
  Filled 2022-12-29 (×3): qty 1

## 2022-12-29 MED ORDER — ONDANSETRON HCL 4 MG/2ML IJ SOLN: 4.0000 mg | Freq: Four times a day (QID) | INTRAMUSCULAR | Status: DC | PRN

## 2022-12-29 MED ORDER — MAGNESIUM SULFATE 40 GM/1000ML IV SOLN
INTRAVENOUS | Status: AC
  Filled 2022-12-29: qty 1000

## 2022-12-29 MED ORDER — CYCLOBENZAPRINE HCL 5 MG PO TABS: 5.0000 mg | ORAL_TABLET | Freq: Three times a day (TID) | ORAL | Status: DC | PRN

## 2022-12-29 MED ORDER — IOHEXOL 350 MG/ML SOLN
75.0000 mL | Freq: Once | INTRAVENOUS | Status: AC | PRN
  Administered 2022-12-29: 75 mL via INTRAVENOUS

## 2022-12-29 MED ORDER — LABETALOL HCL 5 MG/ML IV SOLN: 80.0000 mg | INTRAVENOUS | Status: DC | PRN

## 2022-12-29 MED ORDER — PROMETHAZINE HCL 25 MG RE SUPP: 25.0000 mg | Freq: Four times a day (QID) | RECTAL | Status: DC | PRN

## 2022-12-29 MED ORDER — SODIUM CHLORIDE 0.9 % IV SOLN: INTRAVENOUS | Status: AC | PRN

## 2022-12-29 MED ORDER — HYDRALAZINE HCL 20 MG/ML IJ SOLN: 10.0000 mg | INTRAMUSCULAR | Status: DC | PRN

## 2022-12-29 MED ORDER — LACTATED RINGERS IV SOLN: INTRAVENOUS | Status: AC

## 2022-12-29 NOTE — Progress Notes (Signed)
Mother instructed and educated on how to pump, clean, and store milk.

## 2022-12-29 NOTE — H&P (Signed)
History and Physical   HPI  Kim Maxwell is a 27 y.o. G1P1001 who is s/p cesarean birth on 12/23/22. She has a h/o of GHTN without severe features. She was discharged on 12/25/22 with a prescription for Procardia XL 30 mg but did not fill the prescription d/t confusion about the instructions. She presented to the ED today with nausea, vomiting, and SOB and was found to have severe range blood pressures.Chest CTA was negative. She denies HA, visual changes, and epigastric pain. She does note pain behind her right shoulder blade that sometimes radiates across her back.  OB History  OB History  Gravida Para Term Preterm AB Living  1 1 1  0 0 1  SAB IAB Ectopic Multiple Live Births  0 0 0 0 1    # Outcome Date GA Lbr Len/2nd Weight Sex Type Anes PTL Lv  1 Term 12/23/22 [redacted]w[redacted]d  3520 g M CS-LTranv Gen  LIV     Name: Boy Marnesha Schiefer     Apgar1: 8  Apgar5: 9    Obstetric Comments  History of Polycystic Ovarian Syndrome.  Stated she had last regular period 02/11/2022, and reported a little spotting in Feb. No period in March.      PROBLEM LIST  Pregnancy complications or risks: Patient Active Problem List   Diagnosis Date Noted   Labor and delivery, indication for care 12/23/2022   Elevated blood pressure affecting pregnancy in third trimester, antepartum 12/14/2022   [redacted] weeks gestation of pregnancy 12/14/2022   Gestational hypertension 11/30/2022   Pregnancy 11/30/2022   Maternal varicella, non-immune 11/02/2022   Rubella non-immune status, antepartum 11/02/2022   Obesity in pregnancy 05/21/2022   Supervision of high-risk pregnancy 05/14/2022    Prenatal labs and studies: ABO, Rh: --/--/O POS Performed at [facility], [address] [place], [place] [address]  [phone]) Antibody: NEG (12/02 1814) Rubella: <0.90 (05/24 0922) RPR: NON REACTIVE (12/02 1814)  HBsAg: Negative (05/24 0922)  HIV: Non Reactive (09/30 0933)  JXB:JYNWGNFA/-- (11/25 0908)   Past  Medical History:  Diagnosis Date   Polycystic ovarian syndrome    PONV (postoperative nausea and vomiting)      Past Surgical History:  Procedure Laterality Date   CESAREAN SECTION  12/23/2022   Procedure: CESAREAN SECTION;  Surgeon: Linzie Collin, MD;  Location: [facility] ORS;  Service: Obstetrics;;   ESOPHAGOGASTRODUODENOSCOPY (EGD) WITH PROPOFOL N/A 03/18/2020   Procedure: ESOPHAGOGASTRODUODENOSCOPY (EGD) WITH PROPOFOL;  Surgeon: [name], MD;  Location: [facility] ENDOSCOPY;  Service: Gastroenterology;  Laterality: N/A;     Medications    Current Discharge Medication List     CONTINUE these medications which have NOT CHANGED   Details  docusate sodium (COLACE) 100 MG capsule Take 1 capsule (100 mg total) by mouth 2 (two) times daily. Qty: 60 capsule, Refills: 2    ibuprofen (ADVIL) 600 MG tablet Take 1 tablet (600 mg total) by mouth every 6 (six) hours. Qty: 60 tablet, Refills: 1    lidocaine (LIDODERM) 5 % Place 1 patch onto the skin every 12 (twelve) hours for 14 days. Apply to intact skin above incision site & dressing. Qty: 14 patch, Refills: 1    oxyCODONE (ROXICODONE) 5 MG immediate release tablet Take 1 tablet (5 mg total) by mouth every 4 (four) hours as needed for severe pain (pain score 7-10). Qty: 20 tablet, Refills: 0    potassium chloride (KLOR-CON M) 10 MEQ tablet Take 1 tablet (10 mEq total) by mouth daily for 14  days. Rob Bunting: 14 tablet, Refills: 0    acetaminophen (TYLENOL) 325 MG tablet Take 2 tablets (650 mg total) by mouth every 4 (four) hours as needed (for pain scale < 4  OR  temperature  >/=  100.5 F). Qty: 60 tablet, Refills: 1    NIFEdipine (PROCARDIA-XL/NIFEDICAL-XL) 30 MG 24 hr tablet Take 1 tablet (30 mg total) by mouth daily. Can increase to twice a day as needed for symptomatic contractions Qty: 30 tablet, Refills: 2    simethicone (MYLICON) 80 MG chewable tablet Chew 1 tablet (80 mg total) by mouth 4 (four) times daily as needed for  flatulence. Qty: 30 tablet, Refills: 0         Allergies  Patient has no known allergies.  Review of Systems  Pertinent items are noted in HPI.  Physical Exam  BP (!) 166/99   Pulse 77   Temp 98 F (36.7 C) (Oral)   Resp 15   Ht 4\' 10"  (1.473 m)   Wt 76.2 kg   LMP 02/11/2022 (Exact Date) Comment: Had a little spotting Febuary, reported history of Polycystic Ovarian sydrome  SpO2 100%   BMI 35.11 kg/m   General: pale, alert, cooperative Lungs:  CTA B Cardio: RRR without M/R/G Abd: Soft, NT, negative Murphy's sign, edema to about 3 cm above incision DTRs: 2+, no clonus   See Prenatal records for more detailed PE.    Test Results  Results for orders placed or performed during the hospital encounter of 12/29/22 (from the past 24 hour(s))  Urinalysis, Routine w reflex microscopic -Urine, Clean Catch     Status: Abnormal   Collection Time: 12/29/22  1:07 PM  Result Value Ref Range   Color, Urine YELLOW (A) YELLOW   APPearance HAZY (A) CLEAR   Specific Gravity, Urine 1.021 1.005 - 1.030   pH 6.0 5.0 - 8.0   Glucose, UA NEGATIVE NEGATIVE mg/dL   Hgb urine dipstick LARGE (A) NEGATIVE   Bilirubin Urine NEGATIVE NEGATIVE   Ketones, ur NEGATIVE NEGATIVE mg/dL   Protein, ur NEGATIVE NEGATIVE mg/dL   Nitrite NEGATIVE NEGATIVE   Leukocytes,Ua NEGATIVE NEGATIVE   RBC / HPF 0-5 0 - 5 RBC/hpf   WBC, UA 6-10 0 - 5 WBC/hpf   Bacteria, UA RARE (A) NONE SEEN   Squamous Epithelial / HPF 0-5 0 - 5 /HPF   Mucus PRESENT   Comprehensive metabolic panel     Status: Abnormal   Collection Time: 12/29/22  1:14 PM  Result Value Ref Range   Sodium 138 135 - 145 mmol/L   Potassium 3.9 3.5 - 5.1 mmol/L   Chloride 110 98 - 111 mmol/L   CO2 21 (L) 22 - 32 mmol/L   Glucose, Bld 91 70 - 99 mg/dL   BUN 15 6 - 20 mg/dL   Creatinine, Ser 5.78 0.44 - 1.00 mg/dL   Calcium 8.2 (L) 8.9 - 10.3 mg/dL   Total Protein 6.0 (L) 6.5 - 8.1 g/dL   Albumin 2.9 (L) 3.5 - 5.0 g/dL   AST 30 15 - 41  U/L   ALT 35 0 - 44 U/L   Alkaline Phosphatase 144 (H) 38 - 126 U/L   Total Bilirubin 0.6 <1.2 mg/dL   GFR, Estimated >46 >96 mL/min   Anion gap 7 5 - 15  CBC with Differential     Status: Abnormal   Collection Time: 12/29/22  1:14 PM  Result Value Ref Range   WBC 6.6 4.0 - 10.5 K/uL  RBC 2.68 (L) 3.87 - 5.11 MIL/uL   Hemoglobin 7.6 (L) 12.0 - 15.0 g/dL   HCT 17.6 (L) 16.0 - 73.7 %   MCV 87.3 80.0 - 100.0 fL   MCH 28.4 26.0 - 34.0 pg   MCHC 32.5 30.0 - 36.0 g/dL   RDW 10.6 26.9 - 48.5 %   Platelets 366 150 - 400 K/uL   nRBC 0.0 0.0 - 0.2 %   Neutrophils Relative % 60 %   Neutro Abs 4.0 1.7 - 7.7 K/uL   Lymphocytes Relative 31 %   Lymphs Abs 2.0 0.7 - 4.0 K/uL   Monocytes Relative 6 %   Monocytes Absolute 0.4 0.1 - 1.0 K/uL   Eosinophils Relative 2 %   Eosinophils Absolute 0.1 0.0 - 0.5 K/uL   Basophils Relative 0 %   Basophils Absolute 0.0 0.0 - 0.1 K/uL   Immature Granulocytes 1 %   Abs Immature Granulocytes 0.04 0.00 - 0.07 K/uL  Magnesium     Status: None   Collection Time: 12/29/22  1:14 PM  Result Value Ref Range   Magnesium 1.8 1.7 - 2.4 mg/dL  Lactate dehydrogenase     Status: None   Collection Time: 12/29/22  1:14 PM  Result Value Ref Range   LDH 176 98 - 192 U/L     Assessment  PP HTN with severe features  Patient Active Problem List   Diagnosis Date Noted   Labor and delivery, indication for care 12/23/2022   Elevated blood pressure affecting pregnancy in third trimester, antepartum 12/14/2022   [redacted] weeks gestation of pregnancy 12/14/2022   Gestational hypertension 11/30/2022   Pregnancy 11/30/2022   Maternal varicella, non-immune 11/02/2022   Rubella non-immune status, antepartum 11/02/2022   Obesity in pregnancy 05/21/2022   Supervision of high-risk pregnancy 05/14/2022    Plan Discussed plan with Dr. Lonny Prude -Will admit to L&D for 24 hours of magnesium sulfate -Resume PO Procardia XL 30 mg daily -Lasix 20 mg x 1 -IV Labetalol protocol for  severe range BPs -Pain and nausea management -RUQ [place] d/t pain and nausea -Initiate breast pump  [name], CNM 12/29/2022 5:36 PM

## 2022-12-29 NOTE — Progress Notes (Signed)
 Pt up to ultrasound

## 2022-12-29 NOTE — OB Triage Note (Signed)
Pt is a 27yo postpartum c-section who presents from the ED for complaints of nausea and back pain that had severe blood pressures in the ED. She states that she has not taken her prescribed Procardia dt thinking it was for contractions.  CNM aware of pt arrival.  Cycling BP q75mins.

## 2022-12-29 NOTE — Progress Notes (Signed)
Pt back from ultrasound.

## 2022-12-29 NOTE — ED Provider Triage Note (Signed)
Emergency Medicine Provider Triage Evaluation Note  Kim Maxwell , a 27 y.o. female  was evaluated in triage.  Pt complains of upper back pain. Patient delivered by c-section on 12/4. Back pain started on Thursday. She has dizziness and nausea.  Review of Systems  Positive: Whole body tremors, dizziness, nausea, upper back pain Negative: Headache, vision changes, abdominal pain  Physical Exam  LMP 02/11/2022 (Exact Date) Comment: Had a little spotting Febuary, reported history of Polycystic Ovarian sydrome Gen:   Awake, no distress   Resp:  Normal effort  MSK:   Moves extremities without difficulty  Other:    Medical Decision Making  Medically screening exam initiated at 1:03 PM.  Appropriate orders placed.  Kim Maxwell was informed that the remainder of the evaluation will be completed by another provider, this initial triage assessment does not replace that evaluation, and the importance of remaining in the ED until their evaluation is complete.     Cameron Ali, PA-C 12/29/22 1308

## 2022-12-29 NOTE — ED Triage Notes (Signed)
Pt c/o bilateral leg swelling with dizziness, nausea, and back pain. Pt gave birth on 12/4 of this month. Pt is hypertensive in triage. Pt denies any prior medical issues since giving birth

## 2022-12-29 NOTE — ED Provider Notes (Signed)
[facility] Provider Note    Event Date/Time   First MD Initiated Contact with Patient 12/29/22 1400     (approximate)   History   Leg Swelling and Dizziness   HPI  Sarahjo Poquette is a 27 year old on POD 6 after C-section presenting to the emergency department for evaluation of back pain.  Patient has had ongoing pain since her surgery, but had worsened right upper back pain today.  Woke up and had some lightheadedness and nausea, no syncope.  Has also been hypertensive, was discharged on nifedipine, but was instructed to take this as needed for contraction-like pain so she has not taken it.  Additionally reports ongoing lower extremity swelling, not acutely worsened.      Physical Exam   Triage Vital Signs: ED Triage Vitals  Encounter Vitals Group     BP 12/29/22 1307 (!) 155/110     Systolic BP Percentile --      Diastolic BP Percentile --      Pulse Rate 12/29/22 1307 91     Resp 12/29/22 1307 17     Temp 12/29/22 1307 98 F (36.7 C)     Temp Source 12/29/22 1307 Oral     SpO2 12/29/22 1307 100 %     Weight 12/29/22 1312 168 lb (76.2 kg)     Height 12/29/22 1312 4\' 10"  (1.473 m)     Head Circumference --      Peak Flow --      Pain Score 12/29/22 1311 7     Pain Loc --      Pain Education --      Exclude from Growth Chart --     Most recent vital signs: Vitals:   12/29/22 1600 12/29/22 1630  BP: (!) 142/80 (!) 143/85  Pulse: 92 68  Resp: 20 15  Temp:    SpO2: 99% 100%     General: Awake, interactive  CV:  Regular rate, good peripheral perfusion.  Resp:  Unlabored respirations, lungs clear to auscultation Back:  Reported pain over the right upper back without significant reproducible tenderness to palpation Abd:  Nondistended.  Neuro:  Symmetric facial movement, fluid speech   ED Results / Procedures / Treatments   Labs (all labs ordered are listed, but only abnormal results are displayed) Labs Reviewed  COMPREHENSIVE  METABOLIC PANEL - Abnormal; Notable for the following components:      Result Value   CO2 21 (*)    Calcium 8.2 (*)    Total Protein 6.0 (*)    Albumin 2.9 (*)    Alkaline Phosphatase 144 (*)    All other components within normal limits  CBC WITH DIFFERENTIAL/PLATELET - Abnormal; Notable for the following components:   RBC 2.68 (*)    Hemoglobin 7.6 (*)    HCT 23.4 (*)    All other components within normal limits  URINALYSIS, ROUTINE W REFLEX MICROSCOPIC - Abnormal; Notable for the following components:   Color, Urine YELLOW (*)    APPearance HAZY (*)    Hgb urine dipstick LARGE (*)    Bacteria, UA RARE (*)    All other components within normal limits  MAGNESIUM  LACTATE DEHYDROGENASE  CBC  COMPREHENSIVE METABOLIC PANEL     EKG EKG independently reviewed interpreted by myself (ER attending) demonstrates:    RADIOLOGY Imaging independently reviewed and interpreted by myself demonstrates:    PROCEDURES:  Critical Care performed: No  Procedures   MEDICATIONS ORDERED IN ED: Medications  labetalol (NORMODYNE) injection 10 mg (0 mg Intravenous Hold 12/29/22 1527)  acetaminophen (TYLENOL) tablet 1,000 mg (has no administration in time range)  ibuprofen (ADVIL) tablet 600 mg (has no administration in time range)  sodium chloride 0.9 % bolus 500 mL (has no administration in time range)  methylPREDNISolone sodium succinate (SOLU-MEDROL) 125 mg/2 mL injection 125 mg (has no administration in time range)  EPINEPHrine (EPI-PEN) injection 0.3 mg (has no administration in time range)  albuterol (PROVENTIL) (2.5 MG/3ML) 0.083% nebulizer solution 2.5 mg (has no administration in time range)  diphenhydrAMINE (BENADRYL) injection 25 mg (has no administration in time range)  0.9 %  sodium chloride infusion (has no administration in time range)  iron sucrose (VENOFER) 300 mg in sodium chloride 0.9 % 250 mL IVPB (300 mg Intravenous New Bag/Given 12/29/22 1646)  NIFEdipine  (PROCARDIA-XL/NIFEDICAL-XL) 24 hr tablet 30 mg (0 mg Oral Hold 12/29/22 1641)  ondansetron (ZOFRAN) injection 4 mg (4 mg Intravenous Given 12/29/22 1443)  acetaminophen (TYLENOL) tablet 1,000 mg (1,000 mg Oral Given 12/29/22 1449)  iohexol (OMNIPAQUE) 350 MG/ML injection 75 mL (75 mLs Intravenous Contrast Given 12/29/22 1506)     IMPRESSION / MDM / ASSESSMENT AND PLAN / ED COURSE  I reviewed the triage vital signs and the nursing notes.  Differential diagnosis includes, but is not limited to, preeclampsia, hypertension without preeclampsia, PE, pneumonia, musculoskeletal pain in the setting of recent surgery  Patient's presentation is most consistent with acute presentation with potential threat to life or bodily function.  27 year old female presenting to the Emergency Department for evaluation of back pain in the setting of recent C-section.  Noted to be hypertensive on arrival with some severe range blood pressures.  Labs with anemia with hemoglobin of 7.6, downtrending from prior.  Urine without obvious signs of infection.  No protein noted.  Did have some right upper back pain, so CT of the chest was ordered which resulted without evidence of PE.  In the setting of elevated blood pressure, case was reviewed with OB/GYN team.  As the patient did have multiple severe range blood pressures, they did recommend admission to their service for further management.  IV labetalol was recommended if patient remained with severe level blood pressures, but on return from CT, blood pressure had improved.  Patient admitted for further management.    FINAL CLINICAL IMPRESSION(S) / ED DIAGNOSES   Final diagnoses:  Upper back pain  Lightheadedness  Anemia, unspecified type     Rx / DC Orders   ED Discharge Orders     None        Note:  This document was prepared using Dragon voice recognition software and may include unintentional dictation errors.   Trinna Post, MD 12/29/22 [phone]

## 2022-12-30 ENCOUNTER — Ambulatory Visit: Payer: Medicaid Other | Admitting: Obstetrics and Gynecology

## 2022-12-30 DIAGNOSIS — O139 Gestational [pregnancy-induced] hypertension without significant proteinuria, unspecified trimester: Secondary | ICD-10-CM

## 2022-12-30 DIAGNOSIS — Z9889 Other specified postprocedural states: Secondary | ICD-10-CM

## 2022-12-30 LAB — COMPREHENSIVE METABOLIC PANEL
ALT: 42 U/L (ref 0–44)
AST: 36 U/L (ref 15–41)
Albumin: 3.2 g/dL — ABNORMAL LOW (ref 3.5–5.0)
Alkaline Phosphatase: 151 U/L — ABNORMAL HIGH (ref 38–126)
Anion gap: 9 (ref 5–15)
BUN: 10 mg/dL (ref 6–20)
CO2: 24 mmol/L (ref 22–32)
Calcium: 7.4 mg/dL — ABNORMAL LOW (ref 8.9–10.3)
Chloride: 106 mmol/L (ref 98–111)
Creatinine, Ser: 0.68 mg/dL (ref 0.44–1.00)
GFR, Estimated: >60 mL/min (ref >60)
Glucose, Bld: 125 mg/dL — ABNORMAL HIGH (ref 70–99)
Potassium: 3.5 mmol/L (ref 3.5–5.1)
Sodium: 139 mmol/L (ref 135–145)
Total Bilirubin: 0.4 mg/dL (ref <1.2)
Total Protein: 6.5 g/dL (ref 6.5–8.1)

## 2022-12-30 LAB — CBC
HCT: 24.6 % — ABNORMAL LOW (ref 36.0–46.0)
Hemoglobin: 8.1 g/dL — ABNORMAL LOW (ref 12.0–15.0)
MCH: 27.9 pg (ref 26.0–34.0)
MCHC: 32.9 g/dL (ref 30.0–36.0)
MCV: 84.8 fL (ref 80.0–100.0)
Platelets: 438 10*3/uL — ABNORMAL HIGH (ref 150–400)
RBC: 2.9 MIL/uL — ABNORMAL LOW (ref 3.87–5.11)
RDW: 14.3 % (ref 11.5–15.5)
WBC: 7 10*3/uL (ref 4.0–10.5)
nRBC: 0 % (ref 0.0–0.2)

## 2022-12-30 MED ORDER — FERROUS SULFATE 325 (65 FE) MG PO TABS
325.0000 mg | ORAL_TABLET | Freq: Three times a day (TID) | ORAL | Status: DC
  Administered 2022-12-30 – 2022-12-31 (×2): 325 mg via ORAL
  Filled 2022-12-30 (×2): qty 1

## 2022-12-30 NOTE — Progress Notes (Addendum)
Patient ID: Kim Maxwell, female   DOB: 1995/10/09, 27 y.o.   MRN: [id number] Kim Maxwell is now off of her magnesium. She feels well; no headache or visaul changes. Her pain is controlled and she has not asked for pain medication. Her blood pressures have been normotensive.  BP 116/77   Pulse 83   Temp 97.9 F (36.6 C) (Axillary)   Resp 16   Ht 4\' 10"  (1.473 m)   Wt 76.2 kg   LMP 02/11/2022 (Exact Date) Comment: Had a little spotting Febuary, reported history of Polycystic Ovarian sydrome  SpO2 100%   BMI 35.11 kg/m   Awake and alert Abdomen non tender Results for orders placed or performed during the hospital encounter of 12/29/22 (from the past 24 hour(s))  CBC     Status: Abnormal   Collection Time: 12/30/22  5:13 AM  Result Value Ref Range   WBC 7.0 4.0 - 10.5 K/uL   RBC 2.90 (L) 3.87 - 5.11 MIL/uL   Hemoglobin 8.1 (L) 12.0 - 15.0 g/dL   HCT 11.9 (L) 14.7 - 82.9 %   MCV 84.8 80.0 - 100.0 fL   MCH 27.9 26.0 - 34.0 pg   MCHC 32.9 30.0 - 36.0 g/dL   RDW 56.2 13.0 - 86.5 %   Platelets 438 (H) 150 - 400 K/uL   nRBC 0.0 0.0 - 0.2 %  Comprehensive metabolic panel     Status: Abnormal   Collection Time: 12/30/22  5:13 AM  Result Value Ref Range   Sodium 139 135 - 145 mmol/L   Potassium 3.5 3.5 - 5.1 mmol/L   Chloride 106 98 - 111 mmol/L   CO2 24 22 - 32 mmol/L   Glucose, Bld 125 (H) 70 - 99 mg/dL   BUN 10 6 - 20 mg/dL   Creatinine, Ser 7.84 0.44 - 1.00 mg/dL   Calcium 7.4 (L) 8.9 - 10.3 mg/dL   Total Protein 6.5 6.5 - 8.1 g/dL   Albumin 3.2 (L) 3.5 - 5.0 g/dL   AST 36 15 - 41 U/L   ALT 42 0 - 44 U/L   Alkaline Phosphatase 151 (H) 38 - 126 U/L   Total Bilirubin 0.4 <1.2 mg/dL   GFR, Estimated >69 >62 mL/min   Anion gap 9 5 - 15  Physical Exam Vitals reviewed.  Constitutional:      Appearance: Normal appearance. She is obese.  HENT:     Head: Normocephalic and atraumatic.  Cardiovascular:     Rate and Rhythm: Normal rate and regular rhythm.     Pulses: Normal pulses.      Heart sounds: Normal heart sounds.  Pulmonary:     Effort: Pulmonary effort is normal.     Breath sounds: Normal breath sounds.  Abdominal:     General: Bowel sounds are normal.     Palpations: Abdomen is soft.     Comments: Honeycomb dressing is C, D,I  Genitourinary:    General: Normal vulva.  Musculoskeletal:        General: Normal range of motion.  Skin:    General: Skin is warm and dry.  Neurological:     General: No focal deficit present.     Mental Status: She is alert and oriented to person, place, and time.  Psychiatric:        Mood and Affect: Mood normal.        Behavior: Behavior normal.   A: 7 day Postpartum patient admitted for 24 hours of Magnesium. Magnesium now completed  Will continue with BP checks  q hour, and she may move to Mother Baby unit. Dr. Logan Bores to see her in the morning and manage her incision  and dressing removal. Plan for discharge in the morning.  Kim Maxwell, CNM  12/30/2022 7:05 PM

## 2022-12-30 NOTE — Progress Notes (Signed)
Progress Note Postpartum Day 7  Subjective: Kim Maxwell is resting quietly and comfortably, has been napping. She denies any headache, visual changes, shortness of breath.  She continues on magnesium sulphate infusion. She would like to be discharged later today. Her bleeding is minimal.  She shares two questions about her low iron stores ( she was not told about anemia post delivery) and shares that her Procardia RX order indicated it should be taken for contractions and PRN. Objective: Blood pressure 131/87, pulse 86, temperature 97.9 F (36.6 C), temperature source Axillary, resp. rate 17, height 4\' 10"  (1.473 m), weight 76.2 kg, last menstrual period 02/11/2022, SpO2 100%, unknown if currently breastfeeding.  Physical Exam:  General: cooperative, fatigued, and no distress Lochia: appropriate Uterine Fundus: firm Incision: healing well, Honey comb dressing is in place  an without drainage DVT Evaluation: No evidence of DVT seen on physical exam. Negative Homan's sign. No cords or calf tenderness.  Recent Labs    12/29/22 1314 12/30/22 0513  HGB 7.6* 8.1*  HCT 23.4* 24.6*    Assessment/Plan: Will discontinue the magnesium at 24 hours of infusion time. Recheck Bps for an hour post magnesium.  Plan for discharge this evening. Will continue on Procardia 30 xl Follow up with Dr. Logan Bores tomorrow for a blood pressure check and incision check. Daily iron tablets on discharge.  Mirna Mires, CNM  12/30/2022 1:50 PM     LOS: 1 day   Mirna Mires, CNM 12/30/2022, 1:38 PM

## 2022-12-31 ENCOUNTER — Ambulatory Visit: Payer: Medicaid Other | Admitting: Obstetrics and Gynecology

## 2022-12-31 DIAGNOSIS — O1495 Unspecified pre-eclampsia, complicating the puerperium: Secondary | ICD-10-CM | POA: Insufficient documentation

## 2022-12-31 MED ORDER — ACETAMINOPHEN 500 MG PO TABS: 1000.0000 mg | ORAL_TABLET | Freq: Four times a day (QID) | ORAL | 0 refills | Status: DC | PRN

## 2022-12-31 MED ORDER — FERROUS SULFATE 325 (65 FE) MG PO TABS: 325.0000 mg | ORAL_TABLET | ORAL | 3 refills | Status: AC

## 2022-12-31 MED ORDER — NIFEDIPINE ER 30 MG PO TB24: 30.0000 mg | ORAL_TABLET | Freq: Every day | ORAL | 1 refills | Status: AC

## 2022-12-31 MED ORDER — IBUPROFEN 600 MG PO TABS: 600.0000 mg | ORAL_TABLET | Freq: Four times a day (QID) | ORAL | 0 refills | Status: DC | PRN

## 2022-12-31 NOTE — Discharge Summary (Signed)
Postpartum Discharge Summary     Patient Name: Kim Maxwell DOB: 1995/11/20 MRN: [id number]  Date of admission: 12/29/2022 Delivery date:12/23/2022 Delivering provider: Linzie Collin Date of discharge: 12/31/2022  Admitting diagnosis: Labor and delivery, indication for care [O75.9] Intrauterine pregnancy: [redacted]w[redacted]d     Secondary diagnosis:  Principal Problem:   Labor and delivery, indication for care Active Problems:   Preeclampsia in postpartum period  Additional problems: postpartum care following cesarean delivery    Discharge diagnosis:  Pre-eclampsia with severe features                                               Post partum procedures: magnesium sulfate    Hospital course: admitted at 6d PP for pre-eclampsia with severe features.  Magnesium Sulfate received: Yes: Seizure prophylaxis BMZ received: No Rhophylac:N/A MMR: received 12/25/22 T-DaP: received 11/02/22 Flu: received 11/02/22 RSV Vaccine received: received 11/30/22 Transfusion:No Immunizations administered: Immunization History  Administered Date(s) Administered   Influenza, Seasonal, Injecte, Preservative Fre 11/02/2022   Janssen (J&J) SARS-COV-2 Vaccination 04/29/2019   MMR 12/25/2022   Rsv, Bivalent, Protein Subunit Rsvpref,pf Verdis Frederickson) 11/30/2022   Tdap 11/02/2022   Varicella 12/25/2022    Physical exam  Vitals:   12/30/22 2100 12/31/22 0000 12/31/22 0339 12/31/22 0816  BP:  (!) 137/93 (!) 124/92 135/87  Pulse:  96 97 80  Resp:   18 20  Temp:  97.8 F (36.6 C) 98.6 F (37 C) 98.7 F (37.1 C)  TempSrc:  Oral Oral Oral  SpO2: 100% 100% 98% 99%  Weight:      Height:       General: alert, cooperative, and no distress Lochia: appropriate Uterine Fundus: firm Incision: Honeycomb & steristrips removed, incision site without erythema, discharge or tenderness. DVT Evaluation: No evidence of DVT seen on physical exam. Labs: Lab Results  Component Value Date   WBC 7.0 12/30/2022    HGB 8.1 (L) 12/30/2022   HCT 24.6 (L) 12/30/2022   MCV 84.8 12/30/2022   PLT 438 (H) 12/30/2022      Latest Ref Rng & Units 12/30/2022    5:13 AM  CMP  Glucose 70 - 99 mg/dL 188   BUN 6 - 20 mg/dL 10   Creatinine 4.16 - 1.00 mg/dL 6.06   Sodium 301 - 601 mmol/L 139   Potassium 3.5 - 5.1 mmol/L 3.5   Chloride 98 - 111 mmol/L 106   CO2 22 - 32 mmol/L 24   Calcium 8.9 - 10.3 mg/dL 7.4   Total Protein 6.5 - 8.1 g/dL 6.5   Total Bilirubin <0.9 mg/dL 0.4   Alkaline Phos 38 - 126 U/L 151   AST 15 - 41 U/L 36   ALT 0 - 44 U/L 42    Edinburgh Score:    12/25/2022    2:05 PM  Edinburgh Postnatal Depression Scale Screening Tool  I have been able to laugh and see the funny side of things. 0  I have looked forward with enjoyment to things. 0  I have blamed myself unnecessarily when things went wrong. 2  I have been anxious or worried for no good reason. 0  I have felt scared or panicky for no good reason. 0  Things have been getting on top of me. 1  I have been so unhappy that I have had difficulty sleeping. 0  I  have felt sad or miserable. 1  I have been so unhappy that I have been crying. 1  The thought of harming myself has occurred to me. 0  Edinburgh Postnatal Depression Scale Total 5      After visit meds:  Allergies as of 12/31/2022   No Known Allergies      Medication List     STOP taking these medications    lidocaine 5 % Commonly known as: Lidoderm   potassium chloride 10 MEQ tablet Commonly known as: KLOR-CON M   simethicone 80 MG chewable tablet Commonly known as: MYLICON       TAKE these medications    acetaminophen 500 MG tablet Commonly known as: TYLENOL Take 2 tablets (1,000 mg total) by mouth every 6 (six) hours as needed for mild pain (pain score 1-3) or moderate pain (pain score 4-6). What changed:  medication strength how much to take when to take this reasons to take this   docusate sodium 100 MG capsule Commonly known as:  Colace Take 1 capsule (100 mg total) by mouth 2 (two) times daily.   ferrous sulfate 325 (65 FE) MG tablet Take 1 tablet (325 mg total) by mouth every other day.   ibuprofen 600 MG tablet Commonly known as: ADVIL Take 1 tablet (600 mg total) by mouth every 6 (six) hours as needed for headache or cramping. What changed:  when to take this reasons to take this   NIFEdipine 30 MG 24 hr tablet Commonly known as: ADALAT CC Take 1 tablet (30 mg total) by mouth daily. What changed: additional instructions   oxyCODONE 5 MG immediate release tablet Commonly known as: Roxicodone Take 1 tablet (5 mg total) by mouth every 4 (four) hours as needed for severe pain (pain score 7-10).         Discharge home in stable condition Discharge instruction: per After Visit Summary and Postpartum booklet. Activity: Advance as tolerated. Pelvic rest for 6 weeks.  Diet: routine diet Anticipated Birth Control: Condoms Postpartum Appointment: 5 weeks Additional Postpartum F/U: BP check 1 week Future Appointments: Future Appointments  Date Time Provider Department Center  02/03/2023 10:55 AM Linzie Collin, MD [facility] None   Follow up Visit:  Follow-up Information     [facility] Follow up in 1 week(s).   Specialty: Obstetrics and Gynecology Why: 1 week for blood pressure check & 5 weeks for postpartum visit. Contact information: [address] [place] [address] [phone]                    12/31/2022 Dominica Severin, CNM

## 2022-12-31 NOTE — Progress Notes (Signed)
Patient discharged. Discharge instructions given. Patient verbalizes understanding. Transported by axillary. 

## 2022-12-31 NOTE — Progress Notes (Signed)
Obstetric Postpartum/PostOperative Daily Progress Note Subjective:  27 y.o. G1P1001 post-operative day # 8 status post primary cesarean delivery.  She was readmitted for postpartum pre-eclampsia. She reports she had a mild headache earlier that has resolved with rest. She is ambulating, is tolerating po, is voiding spontaneously.  Her pain is well controlled on PO pain medications. Her lochia is less than menses.   Medications SCHEDULED MEDICATIONS   ferrous sulfate  325 mg Oral TID WC   labetalol  10 mg Intravenous Once   NIFEdipine  30 mg Oral Daily    MEDICATION INFUSIONS   magnesium sulfate Stopped (12/30/22 1802)   promethazine (PHENERGAN) injection (IM or IVPB)     sodium chloride      PRN MEDICATIONS  acetaminophen, albuterol, cyclobenzaprine, diphenhydrAMINE, EPINEPHrine, labetalol **AND** labetalol **AND** labetalol **AND** hydrALAZINE **AND** Measure blood pressure, ibuprofen, methylPREDNISolone (SOLU-MEDROL) injection, ondansetron (ZOFRAN) IV, promethazine **OR** promethazine (PHENERGAN) injection (IM or IVPB) **OR** promethazine, sodium chloride    Objective:   Vitals:   12/30/22 2100 12/31/22 0000 12/31/22 0339 12/31/22 0816  BP:  (!) 137/93 (!) 124/92 135/87  Pulse:  96 97 80  Resp:   18 20  Temp:  97.8 F (36.6 C) 98.6 F (37 C) 98.7 F (37.1 C)  TempSrc:  Oral Oral Oral  SpO2: 100% 100% 98% 99%  Weight:      Height:        Current Vital Signs 24h Vital Sign Ranges  T 98.7 F (37.1 C) Temp  Avg: 98.1 F (36.7 C)  Min: 97.6 F (36.4 C)  Max: 98.7 F (37.1 C)  BP 135/87 BP  Min: 116/77  Max: 141/89  HR 80 Pulse  Avg: 87.6  Min: 80  Max: 97  RR 20 Resp  Avg: 17  Min: 16  Max: 20  SaO2 99 % Room Air SpO2  Avg: 99.6 %  Min: 98 %  Max: 100 %       24 Hour I/O Current Shift I/O  Time Ins Outs 12/11 0701 - 12/12 0700 In: 1936.8 [P.O.:1200; I.V.:736.8] Out: 3900 [Urine:3900] No intake/output data recorded.  General: NAD Pulmonary: no increased work of  breathing Abdomen: non-distended, non-tender, fundus firm at level of umbilicus Inc: Clean/dry/intact Extremities: no edema, no erythema, no tenderness  Labs:  Recent Labs  Lab 12/29/22 1314 12/30/22 0513  WBC 6.6 7.0  HGB 7.6* 8.1*  HCT 23.4* 24.6*  PLT 366 438*     Assessment:   27 y.o. G1P1001 postoperative day # 8 status post primary cesarean section and postpartum pre-eclampsia  Plan:  1) Postpartum pre-eclampsia: continue nifedipine 30mg  daily  2) O POS Performed at [facility], [address] [place], [place] [address]  / Ishmael Holter <0.90 (05/24 0922)/ Varicella NI, received Varivax before discharge  3) TDAP status UTD  4) Contraception = condoms  5) Disposition: discharge home today, BP check in one week.  Dominica Severin, CNM

## 2023-01-01 ENCOUNTER — Ambulatory Visit: Payer: Medicaid Other | Admitting: Obstetrics

## 2023-01-05 ENCOUNTER — Ambulatory Visit: Payer: Medicaid Other

## 2023-01-05 VITALS — BP 139/99 | HR 84 | Ht <= 58 in | Wt 161.0 lb

## 2023-01-05 DIAGNOSIS — Z0130 Encounter for examination of blood pressure without abnormal findings: Secondary | ICD-10-CM

## 2023-01-05 LAB — POCT URINALYSIS DIPSTICK
Bilirubin, UA: NEGATIVE
Blood, UA: NEGATIVE
Glucose, UA: NEGATIVE
Ketones, UA: NEGATIVE
Leukocytes, UA: NEGATIVE
Nitrite, UA: NEGATIVE
Protein, UA: POSITIVE — AB
Spec Grav, UA: 1.02 (ref 1.010–1.025)
Urobilinogen, UA: 0.2 U/dL
pH, UA: 7 (ref 5.0–8.0)

## 2023-01-05 NOTE — Progress Notes (Signed)
    NURSE VISIT NOTE  Subjective:    Patient ID: Layelle Vanalstine, female    DOB: Oct 30, 1995, 27 y.o.   MRN: [id number]  HPI  Patient is a 27 y.o. G52P1001 female who presents for BP check per order from Hartley Barefoot, CNM.   Patient reports compliance with prescribed BP medications: NiFedipine 30 MG  Last dose of BP medication: yesterday at 10-11    BP Readings from Last 3 Encounters:  01/05/23 (!) 163/94  12/31/22 135/87  12/25/22 134/88   Pulse Readings from Last 3 Encounters:  01/05/23 84  12/31/22 80  12/25/22 95    Objective:    BP (!) 163/94   Pulse 84   Ht 4\' 10"  (1.473 m)   Wt 161 lb (73 kg)   Breastfeeding Yes   BMI 33.65 kg/m   Assessment:   1. Blood pressure check      Plan:   Per Dr. Lonny Prude MD Continue current treatment regimen. Continue to monitor blood pressure at home. Will be in person for her 2 week.   Patient verbalized understanding of instructions.   Loney Laurence, CMA

## 2023-01-11 ENCOUNTER — Encounter: Payer: Self-pay | Admitting: Obstetrics and Gynecology

## 2023-01-15 ENCOUNTER — Encounter: Payer: Self-pay | Admitting: Obstetrics and Gynecology

## 2023-01-15 ENCOUNTER — Ambulatory Visit (INDEPENDENT_AMBULATORY_CARE_PROVIDER_SITE_OTHER): Payer: Medicaid Other | Admitting: Obstetrics and Gynecology

## 2023-01-15 VITALS — BP 127/81 | HR 89 | Ht <= 58 in | Wt 162.0 lb

## 2023-01-15 DIAGNOSIS — Z1332 Encounter for screening for maternal depression: Secondary | ICD-10-CM

## 2023-01-15 DIAGNOSIS — O1495 Unspecified pre-eclampsia, complicating the puerperium: Secondary | ICD-10-CM

## 2023-01-15 NOTE — Patient Instructions (Signed)

## 2023-01-15 NOTE — Progress Notes (Signed)
       GYNECOLOGY CLINIC PROGRESS NOTE:   HPI:  Kim Maxwell is a 27 y.o. G19P1001 female who presents for a 3 week televisit for mood check and follow up of her blood pressures. She is 3 weeks postpartum following a cesarean delivery. Pregnancy was complicated with by [facility] on meds.  She was readmitted ~4 days later for postpartum preeclampsia, status post magnesium sulfate therapy.  The delivery was at 37.3 gestational weeks.  Postpartum course has been well so far.  Reports taking her Procardia XL 30 mg as prescribed.  Baby is feeding by breast and formula feed.  Notes that initially her baby did not latch well and so was having to pump more, however notes that yesterday was her first day of full latch.  Notes good milk supply at this time.  Bleeding: Light. Postpartum depression screening: negative.  EDPS score is 4.      The following portions of the patient's history were reviewed and updated as appropriate: allergies, current medications, past family history, past medical history, past social history, past surgical history, and problem list.    Review of Systems - Musculoskeletal ROS: positive for - right hand pain that feels like carpal tunnel that she had during her pregnancy.  All other review of systems negative.  Observations/Objective:   Blood pressure 127/81, pulse 89, height 4\' 10"  (1.473 m), weight 162 lb (73.5 kg), currently breastfeeding. Gen App: NAD Abdomen: Soft, nontender, nondistended.  Well-healing Pfannenstiel incision noted.  Pelvis: deferred.  Extremities: Nontender, no edema noted Neurologic: Grossly normal Psych: normal speech, affect. Good mood.        01/15/2023    4:04 PM 12/25/2022    2:05 PM 05/14/2022    9:28 AM  Edinburgh Postnatal Depression Scale Screening Tool  I have been able to laugh and see the funny side of things. 0 0 0  I have looked forward with enjoyment to things. 0 0 0  I have blamed myself unnecessarily when things went wrong. 2 2 0   I have been anxious or worried for no good reason. 1 0 2  I have felt scared or panicky for no good reason. 1 0 0  Things have been getting on top of me. 0 1 1  I have been so unhappy that I have had difficulty sleeping. 0 0 0  I have felt sad or miserable. 0 1 0  I have been so unhappy that I have been crying. 0 1 0  The thought of harming myself has occurred to me. 0 0 0  Edinburgh Postnatal Depression Scale Total 4 5 3         Assessment and Plan:   1. Encounter for screening for maternal depression - Screening Negative today. Will rescreen at 6 week postpartum visit. Overall doing well.    2. Postpartum state - Overall doing well. Continue routine postpartum home care.    3. Lactating mother -Is now improved with latching.  Also still utilizing breast pump and formula as well.   4.  Preeclampsia in the postpartum period -Blood pressures remain stable at this time.  Continue the Procardia 30 mg as prescribed.  Most likely can discontinue at 6-week postpartum visit.    Hildred Laser, MD [facility]

## 2023-02-03 ENCOUNTER — Ambulatory Visit: Payer: Medicaid Other | Admitting: Obstetrics and Gynecology

## 2023-02-03 ENCOUNTER — Ambulatory Visit (INDEPENDENT_AMBULATORY_CARE_PROVIDER_SITE_OTHER): Payer: Medicaid Other | Admitting: Obstetrics and Gynecology

## 2023-02-03 ENCOUNTER — Encounter: Payer: Self-pay | Admitting: Obstetrics and Gynecology

## 2023-02-03 VITALS — BP 127/87 | HR 74 | Ht <= 58 in | Wt 163.1 lb

## 2023-02-03 DIAGNOSIS — Z9889 Other specified postprocedural states: Secondary | ICD-10-CM

## 2023-02-03 NOTE — Progress Notes (Signed)
 HPI:      Ms. Kim Maxwell is a 28 y.o. G1P1001 who LMP was No LMP recorded.  Subjective:   She presents today 6 weeks postop from cesarean delivery.  She developed postpartum preeclampsia and is currently taking Procardia .  Her blood pressures at home have been good.  She is also taking iron  every other day. She orts no problems with pain eating or going to the bathroom.    Hx: The following portions of the patient's history were reviewed and updated as appropriate:             She  has a past medical history of Polycystic ovarian syndrome and PONV (postoperative nausea and vomiting). She does not have any pertinent problems on file. She  has a past surgical history that includes Esophagogastroduodenoscopy (egd) with propofol  (N/A, 03/18/2020) and Cesarean section (12/23/2022). Her family history includes Healthy in her brother, father, maternal grandfather, mother, paternal grandfather, and paternal grandmother; Hypertension in her maternal grandmother. She  reports that she has never smoked. She has never used smokeless tobacco. She reports that she does not currently use alcohol. She reports that she does not use drugs. She has a current medication list which includes the following prescription(s): ferrous sulfate , nifedipine , and multivitamin-prenatal. She has no known allergies.       Review of Systems:  Review of Systems  Constitutional: Denied constitutional symptoms, night sweats, recent illness, fatigue, fever, insomnia and weight loss.  Eyes: Denied eye symptoms, eye pain, photophobia, vision change and visual disturbance.  Ears/Nose/Throat/Neck: Denied ear, nose, throat or neck symptoms, hearing loss, nasal discharge, sinus congestion and sore throat.  Cardiovascular: Denied cardiovascular symptoms, arrhythmia, chest pain/pressure, edema, exercise intolerance, orthopnea and palpitations.  Respiratory: Denied pulmonary symptoms, asthma, pleuritic pain, productive sputum, cough,  dyspnea and wheezing.  Gastrointestinal: Denied, gastro-esophageal reflux, melena, nausea and vomiting.  Genitourinary: Denied genitourinary symptoms including symptomatic vaginal discharge, pelvic relaxation issues, and urinary complaints.  Musculoskeletal: Denied musculoskeletal symptoms, stiffness, swelling, muscle weakness and myalgia.  Dermatologic: Denied dermatology symptoms, rash and scar.  Neurologic: Denied neurology symptoms, dizziness, headache, neck pain and syncope.  Psychiatric: Denied psychiatric symptoms, anxiety and depression.  Endocrine: Denied endocrine symptoms including hot flashes and night sweats.   Meds:   Current Outpatient Medications on File Prior to Visit  Medication Sig Dispense Refill   ferrous sulfate  325 (65 FE) MG tablet Take 1 tablet (325 mg total) by mouth every other day. 30 tablet 3   NIFEdipine  (ADALAT  CC) 30 MG 24 hr tablet Take 1 tablet (30 mg total) by mouth daily. 30 tablet 1   Prenatal Vit-Fe Fumarate-FA (MULTIVITAMIN-PRENATAL) 27-0.8 MG TABS tablet Take 1 tablet by mouth daily at 12 noon.     No current facility-administered medications on file prior to visit.      Objective:     Vitals:   02/03/23 1032  BP: 127/87  Pulse: 74   Filed Weights   02/03/23 1032  Weight: 163 lb 1.6 oz (74 kg)               Abdomen: Soft.  Non-tender.  No masses.  No HSM.  Incision/s: Intact.  Healing well.  No erythema.  No drainage.             Assessment:    G1P1001 Patient Active Problem List   Diagnosis Date Noted   Preeclampsia in postpartum period 12/31/2022   Labor and delivery, indication for care 12/23/2022   Elevated blood pressure affecting pregnancy in  third trimester, antepartum 12/14/2022   [redacted] weeks gestation of pregnancy 12/14/2022   Gestational hypertension 11/30/2022   Pregnancy 11/30/2022   Maternal varicella, non-immune 11/02/2022   Rubella non-immune status, antepartum 11/02/2022   Obesity in pregnancy 05/21/2022    Supervision of high-risk pregnancy 05/14/2022     1. Postoperative state   2. Postpartum care following cesarean delivery        Plan:            1.  Excellent recovery post op.  2.  Blood pressure is controlled with Procardia -now 6 weeks out.  Recommend discontinuation of Procardia   3.  May discontinue iron  at this time.  Patient states she is eating a good healthy diet. Orders No orders of the defined types were placed in this encounter.   No orders of the defined types were placed in this encounter.     F/U  Return in about 4 months (around 06/03/2023) for Annual Physical.  Delice Felt, M.D. 02/03/2023 11:16 AM

## 2023-02-03 NOTE — Progress Notes (Signed)
 Patient presents today for 6 week postpartum follow-up. Patient had a cesarean delivery on 12/23/22.  She is pumping as well as breast and bottle feeding. She states she would like to use condoms for birth control. EPDS score of 3. She states over the past 2-3 days noticing a small pocket of fluid near her incision, denies any infection symptoms at this time.

## 2023-02-10 ENCOUNTER — Ambulatory Visit: Payer: Medicaid Other | Admitting: Obstetrics and Gynecology
